# Patient Record
Sex: Male | Born: 1945 | Race: White | Hispanic: No | Marital: Married | Smoking: Never smoker
Health system: Southern US, Community
[De-identification: ages and names within clinical notes are randomized; demographics above are authoritative.]

## PROBLEM LIST (undated history)

## (undated) DIAGNOSIS — M179 Osteoarthritis of knee, unspecified: Secondary | ICD-10-CM

## (undated) DIAGNOSIS — K5790 Diverticulosis of intestine, part unspecified, without perforation or abscess without bleeding: Secondary | ICD-10-CM

## (undated) DIAGNOSIS — T18128A Food in esophagus causing other injury, initial encounter: Secondary | ICD-10-CM

## (undated) DIAGNOSIS — E039 Hypothyroidism, unspecified: Secondary | ICD-10-CM

## (undated) DIAGNOSIS — W44F3XA Food entering into or through a natural orifice, initial encounter: Secondary | ICD-10-CM

## (undated) DIAGNOSIS — R001 Bradycardia, unspecified: Secondary | ICD-10-CM

## (undated) DIAGNOSIS — M1710 Unilateral primary osteoarthritis, unspecified knee: Secondary | ICD-10-CM

## (undated) DIAGNOSIS — N401 Enlarged prostate with lower urinary tract symptoms: Secondary | ICD-10-CM

## (undated) HISTORY — PX: TOTAL KNEE ARTHROPLASTY: SHX125

## (undated) HISTORY — PX: EP IMPLANTABLE DEVICE: SHX172B

---

## 2021-04-18 ENCOUNTER — Other Ambulatory Visit (HOSPITAL_COMMUNITY): Payer: Self-pay

## 2021-04-18 ENCOUNTER — Inpatient Hospital Stay
Admission: RE | Admit: 2021-04-18 | Discharge: 2021-04-29 | Disposition: A | Discharge status: Rehabilitation Facility | Payer: Medicare Other | Source: Other Acute Inpatient Hospital | Attending: Internal Medicine | Admitting: Internal Medicine

## 2021-04-18 DIAGNOSIS — E875 Hyperkalemia: Secondary | ICD-10-CM

## 2021-04-18 DIAGNOSIS — R059 Cough, unspecified: Secondary | ICD-10-CM

## 2021-04-18 DIAGNOSIS — Z934 Other artificial openings of gastrointestinal tract status: Secondary | ICD-10-CM

## 2021-04-18 DIAGNOSIS — R5381 Other malaise: Secondary | ICD-10-CM

## 2021-04-18 DIAGNOSIS — G8918 Other acute postprocedural pain: Secondary | ICD-10-CM

## 2021-04-18 DIAGNOSIS — C9110 Chronic lymphocytic leukemia of B-cell type not having achieved remission: Secondary | ICD-10-CM

## 2021-04-18 DIAGNOSIS — D62 Acute posthemorrhagic anemia: Secondary | ICD-10-CM

## 2021-04-18 HISTORY — DX: Unilateral primary osteoarthritis, unspecified knee: M17.10

## 2021-04-18 HISTORY — DX: Osteoarthritis of knee, unspecified: M17.9

## 2021-04-18 HISTORY — DX: Food entering into or through a natural orifice, initial encounter: W44.F3XA

## 2021-04-18 HISTORY — DX: Diverticulosis of intestine, part unspecified, without perforation or abscess without bleeding: K57.90

## 2021-04-18 HISTORY — DX: Bradycardia, unspecified: R00.1

## 2021-04-18 HISTORY — DX: Hypothyroidism, unspecified: E03.9

## 2021-04-18 HISTORY — DX: Benign prostatic hyperplasia with lower urinary tract symptoms: N40.1

## 2021-04-18 HISTORY — DX: Food in esophagus causing other injury, initial encounter: T18.128A

## 2021-04-18 LAB — COMPREHENSIVE METABOLIC PANEL
ALT: 17 U/L (ref 0–44)
AST: 33 U/L (ref 15–41)
Albumin: 2.4 g/dL — ABNORMAL LOW (ref 3.5–5.0)
Alkaline Phosphatase: 106 U/L (ref 38–126)
Anion gap: 4 — ABNORMAL LOW (ref 5–15)
BUN: 23 mg/dL (ref 8–23)
CO2: 24 mmol/L (ref 22–32)
Calcium: 8.2 mg/dL — ABNORMAL LOW (ref 8.9–10.3)
Chloride: 106 mmol/L (ref 98–111)
Creatinine, Ser: 2.01 mg/dL — ABNORMAL HIGH (ref 0.61–1.24)
GFR, Estimated: 34 mL/min — ABNORMAL LOW (ref >60)
Glucose, Bld: 146 mg/dL — ABNORMAL HIGH (ref 70–99)
Potassium: 5 mmol/L (ref 3.5–5.1)
Sodium: 134 mmol/L — ABNORMAL LOW (ref 135–145)
Total Bilirubin: 0.3 mg/dL (ref 0.3–1.2)
Total Protein: 5.8 g/dL — ABNORMAL LOW (ref 6.5–8.1)

## 2021-04-18 LAB — CBC
HCT: 32.2 % — ABNORMAL LOW (ref 39.0–52.0)
Hemoglobin: 10 g/dL — ABNORMAL LOW (ref 13.0–17.0)
MCH: 29.8 pg (ref 26.0–34.0)
MCHC: 31.1 g/dL (ref 30.0–36.0)
MCV: 95.8 fL (ref 80.0–100.0)
Platelets: 377 10*3/uL (ref 150–400)
RBC: 3.36 MIL/uL — ABNORMAL LOW (ref 4.22–5.81)
RDW: 14.3 % (ref 11.5–15.5)
WBC: 165.7 10*3/uL (ref 4.0–10.5)
nRBC: 0 % (ref 0.0–0.2)

## 2021-04-18 LAB — PROTIME-INR
INR: 1.1 (ref 0.8–1.2)
Prothrombin Time: 13.9 seconds (ref 11.4–15.2)

## 2021-04-18 MED ORDER — DIATRIZOATE MEGLUMINE & SODIUM 66-10 % PO SOLN
ORAL | Status: AC
  Filled 2021-04-18: qty 30

## 2021-04-20 LAB — PATHOLOGIST SMEAR REVIEW

## 2021-04-22 LAB — BASIC METABOLIC PANEL
Anion gap: 6 (ref 5–15)
BUN: 31 mg/dL — ABNORMAL HIGH (ref 8–23)
CO2: 24 mmol/L (ref 22–32)
Calcium: 8.4 mg/dL — ABNORMAL LOW (ref 8.9–10.3)
Chloride: 103 mmol/L (ref 98–111)
Creatinine, Ser: 1.84 mg/dL — ABNORMAL HIGH (ref 0.61–1.24)
GFR, Estimated: 38 mL/min — ABNORMAL LOW (ref >60)
Glucose, Bld: 151 mg/dL — ABNORMAL HIGH (ref 70–99)
Potassium: 5.8 mmol/L — ABNORMAL HIGH (ref 3.5–5.1)
Sodium: 133 mmol/L — ABNORMAL LOW (ref 135–145)

## 2021-04-23 LAB — POTASSIUM: Potassium: 4.8 mmol/L (ref 3.5–5.1)

## 2021-04-23 LAB — TSH: TSH: 10.206 u[IU]/mL — ABNORMAL HIGH (ref 0.350–4.500)

## 2021-04-26 NOTE — PMR Pre-admission (Signed)
PMR Admission Coordinator Pre-Admission Assessment  Patient: Clem Wisenbaker is an 75 y.o., male MRN: [id number] DOB: 24-Oct-1946 Height: '5\' 7"'  (1.702 m) Weight: 97.1 kg  Insurance Information HMO:     PPO:      PCP:      IPA:      80/20: yes     OTHER:  PRIMARY: Medicare part A and B      Policy#: [id number]    Subscriber: Pt CM Name:      Phone#: Verified online 5/29     Fax#:  Pre-Cert#:       Employer:  Benefits:  Phone #:      Name:  Eff. Date: Part A effective 10/30/11, Part B effective 11/30/11   Deduct: $1556      Out of Pocket Max:  None      Life Max: N/A  CIR: 100%      SNF: 100 days Outpatient: 80%     Co-Pay: 20% Home Health: 100%      Co-Pay: none DME: 80%     Co-Pay: 20% Providers: patient's choice SECONDARY: [employer]      Policy#: [id number]     Phone#:   Financial Counselor:      Phone#:   The "Data Collection Information Summary" for patients in Inpatient Rehabilitation Facilities with attached "Privacy Act [place] Records" was provided and verbally reviewed with: Patient  Emergency Contact Information Contact Information    Name Relation Home Work Mobile   Averey, Trompeter Spouse   [phone]   Kaycee, Mcgaugh   [phone]      Current Medical History  Patient Admitting Diagnosis: Debility s/p esophageal rupture History of Present Illness: Orvin Netter Lightis a 75 y.o.malewith PMHx of bradycardia s/p PPM and hypothyroidism who presented to [facility]  due to concern for esophageal perforation. He was eating steak and a piece got stuck in his esophagus. He began to cough and then began to experience intense abdominal pain in epigastric area and radiating down his whole left flank. He was not been able to tolerate any PO solids or liquids since that time. He has a history of food impaction and required removal of a piece of chicken with endoscopy in the past. 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 This is a new diagnosis. CT abdomen was also obtained and revealed suspected esophageal rupture with extensive pneumomediastinum as well as extensive adenopathy consistent with CLL.GI consulted for consideration of esophageal stent placement. Pt. Underwent Emergent EGD during laparotomy,G-tube placement, and thoracotomy for repair of esophageal performation with intercostal muscle pedal flap coverage and washout and drain. Pt.'s stay was complicated by ileus and sepsis requiring pressors. Pt. Was initially NPO, but transitioned to puree diet (not to advance) with nocturnal TFs Pt. Was discharged to [facility] on 04/18/21 for wound/drain care. Pt. Is receiving Alloprinol for CLL, with recommendation for outpatient follow up with hematology/oncology following discharge. CIR was consulted to assist in return to PLOF     Patient's medical record from [facility] has been reviewed by the rehabilitation admission coordinator and physician.  Past Medical History  No past medical history on file.  Family History   family history is not on file.  Prior Rehab/Hospitalizations Has the patient had prior rehab or hospitalizations prior to admission? Yes  Has the patient had major surgery during 100 days prior to admission? Yes   Current  Medications No current outpatient medications on file.  Patients Current Diet: Diet puree  Precautions / Restrictions Precautions: Fall Weight Bearing Restrictions: No    Has the patient had 2 or more falls or a fall with injury in the past year? No  Prior Activity Level Household: Pt Limited Community (1-2x/wk): Pt went out 1-2x a week    Prior Functional Level Self Care: Did the patient need help bathing, dressing, using the toilet or eating? Independent  Indoor Mobility: Did the patient need assistance with walking from room to room (with or without device)? Independent  Stairs: Did the patient need  assistance with internal or external stairs (with or without device)? Independent  Functional Cognition: Did the patient need help planning regular tasks such as shopping or remembering to take medications? Independent  Home Assistive Devices / Equipment None   Prior Device Use: Indicate devices/aids used by the patient prior to current illness, exacerbation or injury? None of the above   Prior Functional Level Current Functional Level  Bed Mobility   Independent   Supervision  Transfers    Independent   Min A   Mobility - Walk/Wheelchair    Independent  20 ft x 2 Min-Mod A without AD; 50 ft min guard with RW  Upper Body Dressing  Independent  Min A   Lower Body Dressing    Independent  Min guard   Grooming    Independent  Supervision   Eating/Drinking    Independent  Not documented   Toilet Transfer    Independent  min A    Bladder Continence     Independent  Continent of bladder   Bowel Management    Independent  Continent of bowel   Stair Climbing    Independent    Not attempted  Communication    Independent   Independent   Memory    Independent   Independent    Special Needs/ Care Considerations Skin: Drain site (removed) on neck, surgical wound to abdomen, and full thickness nasal wound from NGT  Previous Home Environment (from acute therapy documentation) Living Arrangements: Spouse/significant other  Lives With: Spouse Available Help at Discharge: Family; Available 24 hours/day Type of Home: House Home Layout: One level Home Access: Stairs to enter Entrance Stairs-Rails: None Entrance Stairs-Number of Steps: 1 Bathroom Shower/Tub: Chiropodist: Standard Bathroom Accessibility: Yes How Accessible: Accessible via walker Home Care Services: Yes   Discharge Living Setting Plans for Discharge Living Setting: Patient's home; House Type of Home at Discharge: House Discharge Home Layout: One level Discharge Home  Access: Stairs to enter Entrance Stairs-Rails: None Entrance Stairs-Number of Steps: 1 Discharge Bathroom Shower/Tub: Tub/shower unit Discharge Bathroom Toilet: Standard Discharge Bathroom Accessibility: Yes How Accessible: Accessible via walker Does the patient have any problems obtaining your medications?: No   Social/Family/Support Systems Patient Roles: Spouse Contact Information: [phone] Anticipated Caregiver: Tayvian Holycross Anticipated Caregiver's Contact Information: [phone] Ability/Limitations of Caregiver: Can provide Min A Caregiver Availability: 24/7 Discharge Plan Discussed with Primary Caregiver: Yes Is Caregiver In Agreement with Plan?: Yes Does Caregiver/Family have Issues with Lodging/Transportation while Pt is in Rehab?: No   Goals Patient/Family Goal for Rehab: PT?OT/SLP Mod I Expected length of stay: 5-7 days Pt/Family Agrees to Admission and willing to participate: Yes Program Orientation Provided & Reviewed with Pt/Caregiver Including Roles  & Responsibilities: Yes   Decrease burden of Care through IP rehab admission: Specialzed equipment needs, Diet advancement (if appropriate), Decrease number of caregivers, Bowel and bladder program and  Patient/family education  Possible need for SNF placement upon discharge: Not anticipated  Patient Condition: I have reviewed medical records from [facility] [place] [facility], spoken with CM, and patient and spouse. I met with patient at the bedside and discussed via phone for inpatient rehabilitation assessment.  Patient will benefit from ongoing PT, OT and SLP, can actively participate in 3 hours of therapy a day 5 days of the week, and can make measurable gains during the admission.  Patient will also benefit from the coordinated team approach during an Inpatient Acute Rehabilitation admission.  The patient will receive intensive therapy as well as Rehabilitation physician, nursing, social worker, and  care management interventions.  Due to safety, skin/wound care, disease management, medication administration, pain management and patient education the patient requires 24 hour a day rehabilitation nursing.  The patient is currently min A-supervision with mobility and basic ADLs.  Discharge setting and therapy post discharge at home with home health is anticipated.  Patient has agreed to participate in the Acute Inpatient Rehabilitation Program and will admit today.  Preadmission Screen Completed By:  Genella Mech, 04/26/2021 10:06 AM ______________________________________________________________________   Discussed status with Dr. Posey Pronto on 04/29/2021  at 35 and received approval for admission today.  Admission Coordinator:  Genella Mech, CCC-SLP, time 10:10 on 04/29/2021   Assessment/Plan: Diagnosis: Debility   1. Does the need for close, 24 hr/day Medical supervision in concert with the patient's rehab needs make it unreasonable for this patient to be served in a less intensive setting? Yes  2. Co-Morbidities requiring supervision/potential complications: bradycardia s/p PPM, hypothyroidism (cont meds, ensure appropriate mood and energy level for therapies), see HPI 3. Due to bowel management, safety, skin/wound care, disease management, pain management and patient education, does the patient require 24 hr/day rehab nursing? Yes 4. Does the patient require coordinated care of a physician, rehab nurse, PT, OT, SLP to address physical and functional deficits in the context of the above medical diagnosis(es)? Yes Addressing deficits in the following areas: balance, endurance, locomotion, strength, transferring, bathing, dressing, swallowing and psychosocial support 5. Can the patient actively participate in an intensive therapy program of at least 3 hrs of therapy 5 days a week? Yes 6. The potential for patient to make measurable gains while on inpatient rehab is excellent and good 7. Anticipated  functional outcomes upon discharge from inpatient rehab: modified independent PT, modified independent OT, modified independent SLP 8. Estimated rehab length of stay to reach the above functional goals is: 5 to 7 days. 9. Anticipated discharge destination: Home 10. Overall Rehab/Functional Prognosis: excellent and good  MD Signature Delice Lesch, MD, ABPMR

## 2021-04-27 LAB — CBC
HCT: 31.5 % — ABNORMAL LOW (ref 39.0–52.0)
Hemoglobin: 9.9 g/dL — ABNORMAL LOW (ref 13.0–17.0)
MCH: 30.7 pg (ref 26.0–34.0)
MCHC: 31.4 g/dL (ref 30.0–36.0)
MCV: 97.8 fL (ref 80.0–100.0)
Platelets: 279 10*3/uL (ref 150–400)
RBC: 3.22 MIL/uL — ABNORMAL LOW (ref 4.22–5.81)
RDW: 14.3 % (ref 11.5–15.5)
WBC: 187.8 10*3/uL (ref 4.0–10.5)
nRBC: 0 % (ref 0.0–0.2)

## 2021-04-27 LAB — BASIC METABOLIC PANEL
Anion gap: 6 (ref 5–15)
BUN: 38 mg/dL — ABNORMAL HIGH (ref 8–23)
CO2: 27 mmol/L (ref 22–32)
Calcium: 8.9 mg/dL (ref 8.9–10.3)
Chloride: 102 mmol/L (ref 98–111)
Creatinine, Ser: 1.83 mg/dL — ABNORMAL HIGH (ref 0.61–1.24)
GFR, Estimated: 38 mL/min — ABNORMAL LOW (ref >60)
Glucose, Bld: 170 mg/dL — ABNORMAL HIGH (ref 70–99)
Potassium: 5.2 mmol/L — ABNORMAL HIGH (ref 3.5–5.1)
Sodium: 135 mmol/L (ref 135–145)

## 2021-04-27 LAB — POTASSIUM: Potassium: 5.3 mmol/L — ABNORMAL HIGH (ref 3.5–5.1)

## 2021-04-28 LAB — POTASSIUM: Potassium: 5.7 mmol/L — ABNORMAL HIGH (ref 3.5–5.1)

## 2021-04-29 ENCOUNTER — Inpatient Hospital Stay (HOSPITAL_COMMUNITY)
Admission: RE | Admit: 2021-04-29 | Discharge: 2021-05-06 | DRG: 945 | Disposition: A | Discharge status: Home Health | Payer: Medicare Other | Source: Other Acute Inpatient Hospital | Attending: Physical Medicine and Rehabilitation | Admitting: Physical Medicine and Rehabilitation

## 2021-04-29 ENCOUNTER — Encounter (HOSPITAL_COMMUNITY): Payer: Self-pay | Admitting: Physical Medicine and Rehabilitation

## 2021-04-29 ENCOUNTER — Other Ambulatory Visit: Payer: Self-pay

## 2021-04-29 ENCOUNTER — Encounter: Payer: Self-pay | Admitting: Internal Medicine

## 2021-04-29 DIAGNOSIS — E039 Hypothyroidism, unspecified: Secondary | ICD-10-CM | POA: Diagnosis present

## 2021-04-29 DIAGNOSIS — D62 Acute posthemorrhagic anemia: Secondary | ICD-10-CM

## 2021-04-29 DIAGNOSIS — M109 Gout, unspecified: Secondary | ICD-10-CM | POA: Diagnosis present

## 2021-04-29 DIAGNOSIS — I10 Essential (primary) hypertension: Secondary | ICD-10-CM | POA: Diagnosis present

## 2021-04-29 DIAGNOSIS — K222 Esophageal obstruction: Secondary | ICD-10-CM | POA: Diagnosis present

## 2021-04-29 DIAGNOSIS — Z6830 Body mass index (BMI) 30.0-30.9, adult: Secondary | ICD-10-CM | POA: Diagnosis not present

## 2021-04-29 DIAGNOSIS — Z96651 Presence of right artificial knee joint: Secondary | ICD-10-CM | POA: Diagnosis present

## 2021-04-29 DIAGNOSIS — E875 Hyperkalemia: Secondary | ICD-10-CM

## 2021-04-29 DIAGNOSIS — Z950 Presence of cardiac pacemaker: Secondary | ICD-10-CM | POA: Diagnosis not present

## 2021-04-29 DIAGNOSIS — C9110 Chronic lymphocytic leukemia of B-cell type not having achieved remission: Secondary | ICD-10-CM

## 2021-04-29 DIAGNOSIS — E119 Type 2 diabetes mellitus without complications: Secondary | ICD-10-CM | POA: Diagnosis present

## 2021-04-29 DIAGNOSIS — R5381 Other malaise: Secondary | ICD-10-CM | POA: Diagnosis present

## 2021-04-29 DIAGNOSIS — G8918 Other acute postprocedural pain: Secondary | ICD-10-CM

## 2021-04-29 DIAGNOSIS — R3915 Urgency of urination: Secondary | ICD-10-CM | POA: Diagnosis present

## 2021-04-29 DIAGNOSIS — H9190 Unspecified hearing loss, unspecified ear: Secondary | ICD-10-CM | POA: Diagnosis present

## 2021-04-29 DIAGNOSIS — E669 Obesity, unspecified: Secondary | ICD-10-CM | POA: Diagnosis present

## 2021-04-29 DIAGNOSIS — Z931 Gastrostomy status: Secondary | ICD-10-CM | POA: Diagnosis not present

## 2021-04-29 DIAGNOSIS — N401 Enlarged prostate with lower urinary tract symptoms: Secondary | ICD-10-CM | POA: Diagnosis present

## 2021-04-29 DIAGNOSIS — R1319 Other dysphagia: Secondary | ICD-10-CM | POA: Diagnosis not present

## 2021-04-29 LAB — GLUCOSE, CAPILLARY
Glucose-Capillary: 134 mg/dL — ABNORMAL HIGH (ref 70–99)
Glucose-Capillary: 140 mg/dL — ABNORMAL HIGH (ref 70–99)

## 2021-04-29 LAB — POTASSIUM: Potassium: 4.3 mmol/L (ref 3.5–5.1)

## 2021-04-29 MED ORDER — ALUM & MAG HYDROXIDE-SIMETH 200-200-20 MG/5ML PO SUSP: 30.0000 mL | ORAL | Status: DC | PRN

## 2021-04-29 MED ORDER — FLEET ENEMA 7-19 GM/118ML RE ENEM: 1.0000 | ENEMA | Freq: Once | RECTAL | Status: DC | PRN

## 2021-04-29 MED ORDER — LEVOTHYROXINE SODIUM 88 MCG PO TABS
88.0000 ug | ORAL_TABLET | Freq: Every day | ORAL | Status: DC
  Administered 2021-04-30 – 2021-05-06 (×7): 88 ug via ORAL
  Filled 2021-04-29 (×7): qty 1

## 2021-04-29 MED ORDER — PROCHLORPERAZINE MALEATE 5 MG PO TABS: 5.0000 mg | ORAL_TABLET | Freq: Four times a day (QID) | ORAL | Status: DC | PRN

## 2021-04-29 MED ORDER — ACETAMINOPHEN 325 MG PO TABS
325.0000 mg | ORAL_TABLET | ORAL | Status: DC | PRN
  Administered 2021-05-02: 500 mg via ORAL
  Filled 2021-04-29: qty 1

## 2021-04-29 MED ORDER — PROSOURCE PLUS PO LIQD
30.0000 mL | Freq: Two times a day (BID) | ORAL | Status: DC
  Administered 2021-04-29 – 2021-05-05 (×13): 30 mL via ORAL
  Filled 2021-04-29 (×7): qty 30

## 2021-04-29 MED ORDER — INSULIN ASPART 100 UNIT/ML IJ SOLN: 0.0000 [IU] | Freq: Every day | INTRAMUSCULAR | Status: DC

## 2021-04-29 MED ORDER — PROCHLORPERAZINE EDISYLATE 10 MG/2ML IJ SOLN
5.0000 mg | Freq: Four times a day (QID) | INTRAMUSCULAR | Status: DC | PRN
Start: 2021-04-29 — End: 2021-05-06

## 2021-04-29 MED ORDER — TRAZODONE HCL 50 MG PO TABS
25.0000 mg | ORAL_TABLET | Freq: Every evening | ORAL | Status: DC | PRN
  Administered 2021-05-01: 50 mg via ORAL
  Filled 2021-04-29: qty 1

## 2021-04-29 MED ORDER — ENOXAPARIN SODIUM 40 MG/0.4ML IJ SOSY
40.0000 mg | PREFILLED_SYRINGE | INTRAMUSCULAR | Status: DC
  Administered 2021-04-30 – 2021-05-05 (×6): 40 mg via SUBCUTANEOUS
  Filled 2021-04-29 (×6): qty 0.4

## 2021-04-29 MED ORDER — GUAIFENESIN-DM 100-10 MG/5ML PO SYRP: 5.0000 mL | ORAL_SOLUTION | Freq: Four times a day (QID) | ORAL | Status: DC | PRN

## 2021-04-29 MED ORDER — INSULIN ASPART 100 UNIT/ML IJ SOLN
0.0000 [IU] | Freq: Three times a day (TID) | INTRAMUSCULAR | Status: DC
  Administered 2021-04-29 – 2021-04-30 (×2): 1 [IU] via SUBCUTANEOUS
  Administered 2021-04-30: 2 [IU] via SUBCUTANEOUS
  Administered 2021-04-30 – 2021-05-03 (×8): 1 [IU] via SUBCUTANEOUS
  Administered 2021-05-03: 2 [IU] via SUBCUTANEOUS
  Administered 2021-05-03 – 2021-05-06 (×8): 1 [IU] via SUBCUTANEOUS

## 2021-04-29 MED ORDER — ALLOPURINOL 100 MG PO TABS
100.0000 mg | ORAL_TABLET | Freq: Every day | ORAL | Status: DC
  Administered 2021-04-30 – 2021-05-06 (×7): 100 mg via ORAL
  Filled 2021-04-29 (×7): qty 1

## 2021-04-29 MED ORDER — OXYCODONE HCL 5 MG PO TABS: 5.0000 mg | ORAL_TABLET | Freq: Four times a day (QID) | ORAL | Status: DC | PRN

## 2021-04-29 MED ORDER — SACCHAROMYCES BOULARDII 250 MG PO CAPS
250.0000 mg | ORAL_CAPSULE | Freq: Two times a day (BID) | ORAL | Status: DC
  Administered 2021-04-29 – 2021-05-06 (×14): 250 mg via ORAL
  Filled 2021-04-29 (×14): qty 1

## 2021-04-29 MED ORDER — DIPHENHYDRAMINE HCL 12.5 MG/5ML PO ELIX: 12.5000 mg | ORAL_SOLUTION | Freq: Four times a day (QID) | ORAL | Status: DC | PRN

## 2021-04-29 MED ORDER — PROCHLORPERAZINE 25 MG RE SUPP: 12.5000 mg | Freq: Four times a day (QID) | RECTAL | Status: DC | PRN

## 2021-04-29 MED ORDER — MELATONIN 3 MG PO TABS
3.0000 mg | ORAL_TABLET | Freq: Every day | ORAL | Status: DC
  Administered 2021-04-29 – 2021-05-05 (×6): 3 mg via ORAL
  Filled 2021-04-29 (×6): qty 1

## 2021-04-29 MED ORDER — BISACODYL 10 MG RE SUPP: 10.0000 mg | Freq: Every day | RECTAL | Status: DC | PRN

## 2021-04-29 MED ORDER — POLYETHYLENE GLYCOL 3350 17 G PO PACK
17.0000 g | PACK | Freq: Every day | ORAL | Status: DC | PRN
  Filled 2021-04-29: qty 1

## 2021-04-29 MED ORDER — PANTOPRAZOLE SODIUM 40 MG PO TBEC
40.0000 mg | DELAYED_RELEASE_TABLET | Freq: Every day | ORAL | Status: DC
  Administered 2021-04-30 – 2021-05-06 (×7): 40 mg via ORAL
  Filled 2021-04-29 (×7): qty 1

## 2021-04-29 NOTE — H&P (Addendum)
Physical Medicine and Rehabilitation Admission H&P    CC: Debility   HPI: Cesar Ayers is a 75 year old male with history of bradycardia s/p PPM, diverticulosis, BPH w/ hesitancy, hypothyroidism, history of food impaction in the past;  who was admitted on [facility] from OSH on 03/29/2021 with esophageal rupture with extensive pneumomediastinum due to impacted piece of meat, extensive mediastinal/retroperitoneal/inguinal adenopathy and significant leukocytosis -179 with numerous blasts . He underwent repair of esophageal perforation with intercostal muscle flap, wash out and drainage by Dr. Lorrin Jackson as well as laparotomy with emergent EGD for FB removal as well as reduction of incarceration of incisional hernia and gastrostomy tube placement on the same day.  Hospital course complicated by septic shock, delirium, ileus, persistent diarrhea with abdominal distension, acute on chronic renal failure, fluid overload, full thickness coumellar wound from NGT treated with local measures per plastics as well as pain. He was started on dysphagia 1 diet ( not advanced due to esophageal stricture) as well as nocturnal tube feeds.   Hematology/oncology consulted and he was started on allopurinol for TLS prophylaxis with plans to follow up on outpatient basis-->wife to schedule.   He was discharged to [facility] on 04/18/2021 for wound drainage/care and debility. Wound have healed well and diet advanced to D3 on 04/28/21. He has had issues with hyperglycemia, intermittent hyperkalemia as well as persistent leucocytosis. TSH elevated at 10.26 and synthroid increased to 88 mcg on 05/26.   Patient with resulting functional deficits with endurance and mobility.  Please see preadmission assessment earlier today as well.   Review of Systems  Constitutional: Negative for chills and fever.  HENT: Positive for hearing loss. Negative for tinnitus.   Eyes: Negative for blurred vision and double vision.  Respiratory: Negative for  cough, shortness of breath and stridor.   Cardiovascular: Positive for chest pain (soreness).  Gastrointestinal: Negative for constipation, heartburn and nausea.  Genitourinary: Negative for dysuria and urgency.  Musculoskeletal: Negative for back pain, joint pain and myalgias.  Skin: Negative for rash.  Neurological: Positive for weakness. Negative for dizziness, focal weakness and headaches.  Psychiatric/Behavioral: The patient is not nervous/anxious and does not have insomnia.   All other systems reviewed and are negative.    Past Medical History:  Diagnosis Date  . Benign prostatic hyperplasia (BPH) with urinary urgency   . Bradycardia   . Diverticulosis   . Food impaction of esophagus   . Hypothyroid   . OA (osteoarthritis) of knee      Past Surgical History:  Procedure Laterality Date  . EP IMPLANTABLE DEVICE    . TOTAL KNEE ARTHROPLASTY Right      Family History  Problem Relation Age of Onset  . Stroke Mother   . Cancer Father   . Cancer Brother        in stomach/advaned on diagnosis     Social History: Married. 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 He smoke cigarrettes briefly. Has not used any alcohol since the 70's. He does not use illicit drugs.     Allergies: No Known Allergies    No medications prior to admission.    Drug Regimen Review  Drug regimen was reviewed and remains appropriate with no significant issues identified  Home: One level home with multiple stairs at entry.    Functional History: Independent without AD  Functional Status:  Mobility: MIn assist for transfers  Min/guard assist to ambulate 68' with RW Min to mod assist for ambulating 20' without  AD.    ADL: Min to min guard assist for ADL tasks.   Cognition:      Physical Exam: There were no vitals taken for this visit. Physical Exam Vitals reviewed.  Constitutional:      Appearance: He is obese.  HENT:     Head: Normocephalic and atraumatic.     Right  Ear: External ear normal.     Left Ear: External ear normal.     Nose: Nose normal.  Eyes:     General:        Right eye: No discharge.        Left eye: No discharge.     Extraocular Movements: Extraocular movements intact.  Cardiovascular:     Rate and Rhythm: Normal rate and regular rhythm.  Pulmonary:     Effort: Pulmonary effort is normal. No respiratory distress.     Breath sounds: No stridor.  Abdominal:     General: Abdomen is flat. Bowel sounds are normal.     Comments: + Feeding tube  Musculoskeletal:     Cervical back: Normal range of motion and neck supple.     Comments: No edema or tenderness in extremities  Skin:    General: Skin is warm and dry.     Comments: Left forehead with nickel size ulcerated lesion. Large dry scab nasal septum.   Neurological:     Mental Status: He is alert.     Comments: Alert HOH Motor: 4+/5 throughout  Psychiatric:        Mood and Affect: Mood normal.        Behavior: Behavior normal.     Results for orders placed or performed during the hospital encounter of 04/18/21 (from the past 48 hour(s))  Potassium     Status: Abnormal   Collection Time: 04/28/21  3:23 AM  Result Value Ref Range   Potassium 5.7 (H) 3.5 - 5.1 mmol/L    Comment: Performed at [facility], [address], [place], [place] [address]  Potassium     Status: None   Collection Time: 04/29/21  4:53 AM  Result Value Ref Range   Potassium 4.3 3.5 - 5.1 mmol/L    Comment: Performed at [facility] [address] [place], [place] [address]   No results found.     Medical Problem List and Plan: 1.  Deficits with mobility, endurance secondary to debility  -patient may not shower  -ELOS/Goals: 5-7 days/mod I  Admit to CIR 2.  Antithrombotics: -DVT/anticoagulation:  Pharmaceutical: Lovenox  -antiplatelet therapy: N/a 3. Pain Management:   Oxycodone as needed 4. Mood: LCSW to follow for evaluation and support.   -antipsychotic agents: N/a 5.  Neuropsych: This patient is capable of making decisions on his own behalf. 6. Skin/Wound Care:   Routine Feeding tube care.   Continue water flushes per tube.  7. Fluids/Electrolytes/Nutrition: Monitor I/O. Check lytes in am.  8.  Hyperkalemia: Question due to tube feeds/CKD.   CMP ordered for tomorrow 9.  CLL:  WBCs 187.8 on 5/30  Follow-up as outpatient [facility] attempting to set F/u)  Continue to monitor 10.  Acute blood loss anemia  Hemoglobin 9.9 on 5/30  CBC ordered for tomorrow 11. H/o esophageal stricture:  Tolerating Dysphagia 3, thins with good intake.   Add intermittent supervision at meals 12. T2DM- diet controlled.  HbA1c ordered for tomorrow AM  Continue to monitor BS ac/hs 12. CKD: Baseline SCr 1.3 per records review  SCr improving from  2.0-->1.83.   CMP ordered for tomorrow 13. Hypothyroid: Was on 75 mcg/day PTA.   -- Synthroid increased to 88 mcg on 05/26.    Bary Leriche, PA-C 04/29/2021   I have personally performed a face to face diagnostic evaluation, including, but not limited to relevant history and physical exam findings, of this patient and developed relevant assessment and plan.  Additionally, I have reviewed and concur with the physician assistant's documentation above.  Delice Lesch, MD, ABPMR  The patient's status has not changed. Any changes from the pre-admission screening or documentation from the acute chart are noted above.   Delice Lesch, MD, ABPMR

## 2021-04-29 NOTE — H&P (Signed)
Physical Medicine and Rehabilitation Admission H&P    CC: Debility   HPI: Cesar Ayers is a 75 year old male with history of bradycardia s/p PPM, diverticulosis, BPH w/ hesitancy, hypothyroidism, history of food impaction in the past;  who [facility] with esophageal rupture with extensive pneumomediastinum due to impacted piece of meat, extensive mediastinal/retroperitoneal/inguinal adenopathy and significant leukocytosis -179 with numerous blasts . He underwent repair of esophageal perforation with intercostal muscle flap, wash out and drainage by Dr. Lorrin Jackson as well as laparotomy with emergent EGD for FB removal as well as reduction of incarceration of incisional hernia and gastrostomy tube placement on the same day.  Hospital course complicated by septic shock, delirium, ileus, persistent diarrhea with abdominal distension, acute on chronic renal failure, fluid overload, full thickness coumellar wound from NGT treated with local measures per plastics as well as pain. He was started on dysphagia 1 diet ( not advanced due to esophageal stricture) as well as nocturnal tube feeds.   Hematology/oncology consulted and he was started on allopurinol for TLS prophylaxis with plans to follow up on outpatient basis-->wife to schedule.   He was discharged to [facility] on 04/18/2021 for wound drainage/care and debility. Wound have healed well and diet advanced to D3 on 04/28/21. He has had issues with hyperglycemia, intermittent hyperkalemia as well as persistent leucocytosis. TSH elevated at 10.26 and synthroid increased to 88 mcg on 05/26.   Patient with resulting functional deficits with endurance and mobility.  Please see preadmission assessment earlier today as well.   Review of Systems  Constitutional: Negative for chills and fever.  HENT: Positive for hearing loss. Negative for tinnitus.   Eyes: Negative for blurred vision and double vision.  Respiratory: Negative for  cough, shortness of breath and stridor.   Cardiovascular: Positive for chest pain (soreness).  Gastrointestinal: Negative for constipation, heartburn and nausea.  Genitourinary: Negative for dysuria and urgency.  Musculoskeletal: Negative for back pain, joint pain and myalgias.  Skin: Negative for rash.  Neurological: Positive for weakness. Negative for dizziness, focal weakness and headaches.  Psychiatric/Behavioral: The patient is not nervous/anxious and does not have insomnia.   All other systems reviewed and are negative.    Past Medical History:  Diagnosis Date  . Benign prostatic hyperplasia (BPH) with urinary urgency   . Bradycardia   . Diverticulosis   . Food impaction of esophagus   . Hypothyroid   . OA (osteoarthritis) of knee      Past Surgical History:  Procedure Laterality Date  . EP IMPLANTABLE DEVICE    . TOTAL KNEE ARTHROPLASTY Right      Family History  Problem Relation Age of Onset  . Stroke Mother   . Cancer Father   . Cancer Brother        in stomach/advaned on diagnosis     Social History: Married. 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 He smoke cigarrettes briefly. Has not used any alcohol since the 70's. He does not use illicit drugs.     Allergies: No Known Allergies    No medications prior to admission.    Drug Regimen Review  Drug regimen was reviewed and remains appropriate with no significant issues identified  Home: One level home with multiple stairs at entry.    Functional History: Independent without AD  Functional Status:  Mobility: MIn assist for transfers  Min/guard assist to ambulate 45' with RW Min to mod assist for ambulating 20' without  AD.    ADL: Min to min guard assist for ADL tasks.   Cognition:      Physical Exam: There were no vitals taken for this visit. Physical Exam Vitals reviewed.  Constitutional:      Appearance: He is obese.  HENT:     Head: Normocephalic and atraumatic.     Right  Ear: External ear normal.     Left Ear: External ear normal.     Nose: Nose normal.  Eyes:     General:        Right eye: No discharge.        Left eye: No discharge.     Extraocular Movements: Extraocular movements intact.  Cardiovascular:     Rate and Rhythm: Normal rate and regular rhythm.  Pulmonary:     Effort: Pulmonary effort is normal. No respiratory distress.     Breath sounds: No stridor.  Abdominal:     General: Abdomen is flat. Bowel sounds are normal.     Comments: + Feeding tube  Musculoskeletal:     Cervical back: Normal range of motion and neck supple.     Comments: No edema or tenderness in extremities  Skin:    General: Skin is warm and dry.     Comments: Left forehead with nickel size ulcerated lesion. Large dry scab nasal septum.   Neurological:     Mental Status: He is alert.     Comments: Alert HOH Motor: 4+/5 throughout  Psychiatric:        Mood and Affect: Mood normal.        Behavior: Behavior normal.     Results for orders placed or performed during the hospital encounter of 04/18/21 (from the past 48 hour(s))  Potassium     Status: Abnormal   Collection Time: 04/28/21  3:23 AM  Result Value Ref Range   Potassium 5.7 (H) 3.5 - 5.1 mmol/L    Comment: Performed at [facility], [address] [place], [place] [address]  Potassium     Status: None   Collection Time: 04/29/21  4:53 AM  Result Value Ref Range   Potassium 4.3 3.5 - 5.1 mmol/L    Comment: Performed at [facility] [address], [place], [place] [address]   No results found.     Medical Problem List and Plan: 1.  Deficits with mobility, endurance secondary to debility  -patient may not shower  -ELOS/Goals: 5-7 days/mod I  Admit to CIR 2.  Antithrombotics: -DVT/anticoagulation:  Pharmaceutical: Lovenox  -antiplatelet therapy: N/a 3. Pain Management:   Oxycodone as needed 4. Mood: LCSW to follow for evaluation and support.   -antipsychotic agents: N/a 5.  Neuropsych: This patient is capable of making decisions on his own behalf. 6. Skin/Wound Care:   Routine Feeding tube care.   Continue water flushes per tube.  7. Fluids/Electrolytes/Nutrition: Monitor I/O. Check lytes in am.  8.  Hyperkalemia: Question due to tube feeds/CKD.   CMP ordered for tomorrow 9.  CLL:  WBCs 187.8 on 5/30  Follow-up as outpatient [facility] attempting to set F/u)  Continue to monitor 10.  Acute blood loss anemia  Hemoglobin 9.9 on 5/30  CBC ordered for tomorrow 11. H/o esophageal stricture:  Tolerating Dysphagia 3, thins with good intake.   Add intermittent supervision at meals 12. T2DM- diet controlled.  HbA1c ordered for tomorrow AM  Continue to monitor BS ac/hs 12. CKD: Baseline SCr 1.3 per records review  SCr improving from  2.0-->1.83.   CMP ordered for tomorrow 13. Hypothyroid: Was on 75 mcg/day PTA.   -- Synthroid increased to 88 mcg on 05/26.    Bary Leriche, PA-C 04/29/2021   I have personally performed a face to face diagnostic evaluation, including, but not limited to relevant history and physical exam findings, of this patient and developed relevant assessment and plan.  Additionally, I have reviewed and concur with the physician assistant's documentation above.  Delice Lesch, MD, ABPMR

## 2021-04-29 NOTE — Progress Notes (Signed)
PMR Admission Coordinator Pre-Admission Assessment  Patient: Cesar Ayers is an 75 y.o., male MRN: [id number] DOB: 07/31/1946 Height: '5\' 7"'  (1.702 m) Weight: 97.1 kg  Insurance Information HMO:     PPO:      PCP:      IPA:      80/20: yes     OTHER:  PRIMARY: Medicare part A and B      Policy#: [id number]    Subscriber: Pt CM Name:      Phone#: Verified online 5/29     Fax#:  Pre-Cert#:       Employer:  Benefits:  Phone #:      Name:  Eff. Date: Part A effective 10/30/11, Part B effective 11/30/11   Deduct: $6546TKP of [place] Max: NoneLife Max: N/A CIR:100%SNF: 100 days Outpatient:80%Co-Pay: 20% Home Health:100%Co-Pay: none DME:80%Co-Pay: 20% Providers:patient's choice SECONDARY: [employer]      Policy#: [id number]     Phone#:   Financial Counselor:      Phone#:   The "Data Collection Information Summary" for patients in Inpatient Rehabilitation Facilities with attached "Privacy Act [place] Records" was provided and verbally reviewed with: Patient  Emergency Contact Information         Contact Information    Name Relation Home Work Mobile   Cesar, Ayers Spouse   [phone]   Cesar, Ayers   [phone]      Current Medical History  Patient Admitting Diagnosis: Debility s/p esophageal rupture History of Present Illness: Cesar Dewan Lightis a 75 y.o.malewith PMHx of bradycardia s/p PPM and hypothyroidism who presented to [facility]  due to concern for esophageal perforation. He was eating steak and a piece got stuck in his esophagus. He began to cough and then began to experience intense abdominal pain in epigastric area and radiating down his whole left flank. He was not been able to tolerate any PO solids or liquids since that time. He has a history of food impaction and required removal of a piece of chicken with endoscopy in the past. 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 This is a new diagnosis. CT abdomen was also obtained and revealed suspected esophageal rupture with extensive pneumomediastinum as well as extensive adenopathy consistent with CLL.GI consulted for consideration of esophageal stent placement. Pt. Underwent Emergent EGD during laparotomy,G-tube placement, and thoracotomy for repair of esophageal performation with intercostal muscle pedal flap coverage and washout and drain. Pt.'s stay was complicated by ileus and sepsis requiring pressors. Pt. Was initially NPO, but transitioned to puree diet (not to advance) with nocturnal TFs Pt. Was discharged to [facility] on 04/18/21 for wound/drain care. Pt. Is receiving Alloprinol for CLL, with recommendation for outpatient follow up with hematology/oncology following discharge. CIR was consulted to assist in return to PLOF     Patient's medical record from [facility] has been reviewed by the rehabilitation admission coordinator and physician.  Past Medical History  No past medical history on file.  Family History   family history is not on file.  Prior Rehab/Hospitalizations Has the patient had prior rehab or hospitalizations prior to admission? Yes  Has the patient had major surgery during 100 days prior to admission? Yes             Current Medications No current outpatient medications on file.  Patients Current Diet: Diet puree  Precautions / Restrictions Precautions: Fall Weight Bearing Restrictions: No  Has the patient had 2 or more falls or a fall with injury in the past year? No  Prior Activity Level Household: Pt Limited Community (1-2x/wk): Pt went out 1-2x a week    Prior Functional Level Self Care: Did the patient need help bathing, dressing, using the toilet or eating? Independent  Indoor Mobility: Did the patient need assistance with walking from room to room (with or without device)?  Independent  Stairs: Did the patient need assistance with internal or external stairs (with or without device)? Independent  Functional Cognition: Did the patient need help planning regular tasks such as shopping or remembering to take medications? Independent  Home Assistive Devices / Equipment None   Prior Device Use: Indicate devices/aids used by the patient prior to current illness, exacerbation or injury? None of the above   Prior Functional Level Current Functional Level  Bed Mobility   Independent Supervision  Transfers    Independent   Min A   Mobility - Walk/Wheelchair  Independent  20 ft x 2 Min-Mod A without AD; 50 ft min guard with RW  Upper Body Dressing  Independent  Min A   Lower Body Dressing  Independent  Min guard   Grooming  Independent  Supervision   Eating/Drinking  Independent  Not documented   Toilet Transfer  Independent  min A    Bladder Continence   Independent  Continent of bladder   Bowel Management  Independent  Continent of bowel   Stair Climbing    Independent  Not attempted  Communication  Independent   Independent   Memory    Independent Independent    Special Needs/ Care Considerations Skin: Drain site (removed) on neck, surgical wound to abdomen, and full thickness nasal wound from NGT  Previous Home Environment (from acute therapy documentation) Living Arrangements: Spouse/significant other  Lives With: Spouse Available Help at Discharge: Family; Available 24 hours/day Type of Home: House Home Layout: One level Home Access: Stairs to enter Entrance Stairs-Rails: None Entrance Stairs-Number of Steps: 1 Bathroom Shower/Tub: Chiropodist: Standard Bathroom Accessibility: Yes How Accessible: Accessible via walker Home Care Services: Yes   Discharge Living Setting Plans for Discharge Living Setting: Patient's home; House Type of Home at Discharge:  House Discharge Home Layout: One level Discharge Home Access: Stairs to enter Entrance Stairs-Rails: None Entrance Stairs-Number of Steps: 1 Discharge Bathroom Shower/Tub: Tub/shower unit Discharge Bathroom Toilet: Standard Discharge Bathroom Accessibility: Yes How Accessible: Accessible via walker Does the patient have any problems obtaining your medications?: No   Social/Family/Support Systems Patient Roles: Spouse Contact Information: [phone] Anticipated Caregiver: Philbert Ocallaghan Anticipated Caregiver's Contact Information: [phone] Ability/Limitations of Caregiver: Can provide Min A Caregiver Availability: 24/7 Discharge Plan Discussed with Primary Caregiver: Yes Is Caregiver In Agreement with Plan?: Yes Does Caregiver/Family have Issues with Lodging/Transportation while Pt is in Rehab?: No   Goals Patient/Family Goal for Rehab: PT?OT/SLP Mod I Expected length of stay: 5-7 days Pt/Family Agrees to Admission and willing to participate: Yes Program Orientation Provided & Reviewed with Pt/Caregiver Including Roles  & Responsibilities: Yes   Decrease burden of Care through IP rehab admission: Specialzed equipment needs, Diet advancement (if appropriate), Decrease number of caregivers, Bowel and bladder program and Patient/family education  Possible need for SNF placement upon discharge: Not anticipated  Patient Condition: I have reviewed medical records from [facility] and [place], spoken with CM, and patient and spouse. I met with patient at the bedside and discussed via phone for inpatient  rehabilitation assessment.  Patient will benefit from ongoing PT, OT and SLP, can actively participate in 3 hours of therapy a day 5 days of the week, and can make measurable gains during the admission.  Patient will also benefit from the coordinated team approach during an Inpatient Acute Rehabilitation admission.  The patient will receive intensive therapy as  well as Rehabilitation physician, nursing, social worker, and care management interventions.  Due to safety, skin/wound care, disease management, medication administration, pain management and patient education the patient requires 24 hour a day rehabilitation nursing.  The patient is currently min A-supervision with mobility and basic ADLs.  Discharge setting and therapy post discharge at home with home health is anticipated.  Patient has agreed to participate in the Acute Inpatient Rehabilitation Program and will admit today.  Preadmission Screen Completed By:  Genella Mech, 04/26/2021 10:06 AM ______________________________________________________________________   Discussed status with Dr. Posey Pronto on 04/29/2021  at 45 and received approval for admission today.  Admission Coordinator:  Genella Mech, CCC-SLP, time 10:10 on 04/29/2021   Assessment/Plan: Diagnosis: Debility   1. Does the need for close, 24 hr/day Medical supervision in concert with the patient's rehab needs make it unreasonable for this patient to be served in a less intensive setting? Yes  2. Co-Morbidities requiring supervision/potential complications: bradycardia s/p PPM, hypothyroidism (cont meds, ensure appropriate mood and energy level for therapies), see HPI 3. Due to bowel management, safety, skin/wound care, disease management, pain management and patient education, does the patient require 24 hr/day rehab nursing? Yes 4. Does the patient require coordinated care of a physician, rehab nurse, PT, OT, SLP to address physical and functional deficits in the context of the above medical diagnosis(es)? Yes Addressing deficits in the following areas: balance, endurance, locomotion, strength, transferring, bathing, dressing, swallowing and psychosocial support 5. Can the patient actively participate in an intensive therapy program of at least 3 hrs of therapy 5 days a week? Yes 6. The potential for patient to make measurable  gains while on inpatient rehab is excellent and good 7. Anticipated functional outcomes upon discharge from inpatient rehab: modified independent PT, modified independent OT, modified independent SLP 8. Estimated rehab length of stay to reach the above functional goals is: 5 to 7 days. 9. Anticipated discharge destination: Home 10. Overall Rehab/Functional Prognosis: excellent and good  MD Signature Delice Lesch, MD, ABPMR

## 2021-04-30 DIAGNOSIS — R1319 Other dysphagia: Secondary | ICD-10-CM

## 2021-04-30 DIAGNOSIS — C9110 Chronic lymphocytic leukemia of B-cell type not having achieved remission: Secondary | ICD-10-CM

## 2021-04-30 DIAGNOSIS — E875 Hyperkalemia: Secondary | ICD-10-CM

## 2021-04-30 DIAGNOSIS — R5381 Other malaise: Principal | ICD-10-CM

## 2021-04-30 LAB — CBC WITH DIFFERENTIAL/PLATELET
Abs Immature Granulocytes: 0.54 10*3/uL — ABNORMAL HIGH (ref 0.00–0.07)
Basophils Absolute: 1.7 10*3/uL — ABNORMAL HIGH (ref 0.0–0.1)
Basophils Relative: 1 %
Eosinophils Absolute: 0.6 10*3/uL — ABNORMAL HIGH (ref 0.0–0.5)
Eosinophils Relative: 0 %
HCT: 31.7 % — ABNORMAL LOW (ref 39.0–52.0)
Hemoglobin: 9.8 g/dL — ABNORMAL LOW (ref 13.0–17.0)
Immature Granulocytes: 0 %
Lymphocytes Relative: 88 %
Lymphs Abs: 182 10*3/uL — ABNORMAL HIGH (ref 0.7–4.0)
MCH: 30.2 pg (ref 26.0–34.0)
MCHC: 30.9 g/dL (ref 30.0–36.0)
MCV: 97.8 fL (ref 80.0–100.0)
Monocytes Absolute: 15.6 10*3/uL — ABNORMAL HIGH (ref 0.1–1.0)
Monocytes Relative: 8 %
Neutro Abs: 5.7 10*3/uL (ref 1.7–7.7)
Neutrophils Relative %: 3 %
Platelets: 253 10*3/uL (ref 150–400)
RBC: 3.24 MIL/uL — ABNORMAL LOW (ref 4.22–5.81)
RDW: 14.6 % (ref 11.5–15.5)
WBC: 206.1 10*3/uL (ref 4.0–10.5)
nRBC: 0 % (ref 0.0–0.2)

## 2021-04-30 LAB — GLUCOSE, CAPILLARY
Glucose-Capillary: 158 mg/dL — ABNORMAL HIGH (ref 70–99)
Glucose-Capillary: 150 mg/dL — ABNORMAL HIGH (ref 70–99)
Glucose-Capillary: 132 mg/dL — ABNORMAL HIGH (ref 70–99)

## 2021-04-30 LAB — COMPREHENSIVE METABOLIC PANEL
ALT: 24 U/L (ref 0–44)
AST: 39 U/L (ref 15–41)
Albumin: 2.8 g/dL — ABNORMAL LOW (ref 3.5–5.0)
Alkaline Phosphatase: 116 U/L (ref 38–126)
Anion gap: 7 (ref 5–15)
BUN: 34 mg/dL — ABNORMAL HIGH (ref 8–23)
CO2: 25 mmol/L (ref 22–32)
Calcium: 8.6 mg/dL — ABNORMAL LOW (ref 8.9–10.3)
Chloride: 101 mmol/L (ref 98–111)
Creatinine, Ser: 1.93 mg/dL — ABNORMAL HIGH (ref 0.61–1.24)
GFR, Estimated: 36 mL/min — ABNORMAL LOW (ref >60)
Glucose, Bld: 147 mg/dL — ABNORMAL HIGH (ref 70–99)
Potassium: 5.3 mmol/L — ABNORMAL HIGH (ref 3.5–5.1)
Sodium: 133 mmol/L — ABNORMAL LOW (ref 135–145)
Total Bilirubin: 0.6 mg/dL (ref 0.3–1.2)
Total Protein: 6.1 g/dL — ABNORMAL LOW (ref 6.5–8.1)

## 2021-04-30 MED ORDER — SODIUM ZIRCONIUM CYCLOSILICATE 5 G PO PACK
5.0000 g | PACK | Freq: Once | ORAL | Status: AC
  Administered 2021-04-30: 5 g via ORAL
  Filled 2021-04-30: qty 1

## 2021-04-30 MED ORDER — ENSURE ENLIVE PO LIQD
237.0000 mL | Freq: Two times a day (BID) | ORAL | Status: DC
  Administered 2021-04-30 – 2021-05-02 (×5): 237 mL via ORAL

## 2021-04-30 NOTE — Evaluation (Signed)
Occupational Therapy Assessment and Plan  Patient Details  Name: Cesar Ayers MRN: [id number] Date of Birth: 1946-09-11  OT Diagnosis: abnormal posture and muscle weakness (generalized) Rehab Potential:   ELOS: 5-7 days   Today's Date: 04/30/2021 OT Individual Time: 9417-4081 OT Individual Time Calculation (min): 70 min     Hospital Problem: Principal Problem:   Debility   Past Medical History:  Past Medical History:  Diagnosis Date  . Benign prostatic hyperplasia (BPH) with urinary urgency   . Bradycardia   . Diverticulosis   . Food impaction of esophagus   . Hypothyroid   . OA (osteoarthritis) of knee    Past Surgical History:  Past Surgical History:  Procedure Laterality Date  . EP IMPLANTABLE DEVICE    . TOTAL KNEE ARTHROPLASTY Right     Assessment & Plan Clinical Impression: Cesar Ayers is a 75 year old male with history of bradycardia s/p PPM, diverticulosis, BPH w/ hesitancy, hypothyroidism, history of food impaction in the past;  who was admitted on [facility] from OSH on 03/30/2019 with esophageal rupture with extensive pneumomediastinum due to impacted piece of meat, extensive mediastinal/retroperitoneal/inguinal adenopathy and significant leukocytosis -179 with numerous blasts . He underwent repair of esophageal perforation with intercostal muscle flap, wash out and drainage by Dr. Lorrin Jackson as well as laparotomy with emergent EGD for FB removal as well as reduction of incarceration of incisional hernia and gastrostomy tube placement on the same day.  Hospital course complicated by septic shock, delirium, ileus, persistent diarrhea with abdominal distension, acute on chronic renal failure, fluid overload, full thickness coumellar wound from NGT treated with local measures per plastics as well as pain. He was started on dysphagia 1 diet ( not advanced due to esophageal stricture) as well as nocturnal tube feeds.   Hematology/oncology consulted and he was started on allopurinol  for TLS prophylaxis with plans to follow up on outpatient basis-->wife to schedule.   He was discharged to [facility] on 04/18/2021 for wound drainage/care and debility. Wound have healed well and diet advanced to D3 on 04/28/21. He has had issues with hyperglycemia, intermittent hyperkalemia as well as persistent leucocytosis. TSH elevated at 10.26 and synthroid increased to 88 mcg on 05/26.   Patient with resulting functional deficits with endurance and mobility.  Please see preadmission assessment earlier today as well. Patient transferred to CIR on 04/29/2021 .    Patient currently requires min with basic self-care skills secondary to muscle weakness, decreased cardiorespiratoy endurance, decreased coordination and decreased motor planning and decreased sitting balance, decreased standing balance, decreased postural control and decreased balance strategies.  Prior to hospitalization, patient could complete BADL with independent .  Patient will benefit from skilled intervention to decrease level of assist with basic self-care skills and increase independence with basic self-care skills prior to discharge home with care partner.  Anticipate patient will require intermittent supervision and follow up home health.  OT - End of Session Activity Tolerance: Tolerates 30+ min activity with multiple rests Endurance Deficit: Yes OT Assessment OT Patient demonstrates impairments in the following area(s): Balance;Endurance;Motor;Safety;Sensory OT Basic ADL's Functional Problem(s): Grooming;Bathing;Dressing;Toileting OT Transfers Functional Problem(s): Toilet;Tub/Shower OT Additional Impairment(s): None OT Plan OT Intensity: Minimum of 1-2 x/day, 45 to 90 minutes OT Frequency: 5 out of 7 days OT Duration/Estimated Length of Stay: 5-7 days OT Treatment/Interventions: Balance/vestibular training;Discharge planning;Pain management;Self Care/advanced ADL retraining;Therapeutic Activities;UE/LE Coordination  activities;Visual/perceptual remediation/compensation;Therapeutic Exercise;Skin care/wound managment;Patient/family education;Functional mobility training;Disease mangement/prevention;Community reintegration;Cognitive remediation/compensation;DME/adaptive equipment instruction;Neuromuscular re-education;Psychosocial support;UE/LE Strength taining/ROM;Wheelchair propulsion/positioning OT Self Feeding  Anticipated Outcome(s): no goal OT Basic Self-Care Anticipated Outcome(s): Supervision OT Toileting Anticipated Outcome(s): Supervision OT Bathroom Transfers Anticipated Outcome(s): Supervision OT Recommendation Patient destination: Home Follow Up Recommendations: Home health OT Equipment Recommended: 3 in 1 bedside comode;To be determined   OT Evaluation Precautions/Restrictions  Precautions Precautions: Fall Restrictions Weight Bearing Restrictions: No Pain Pain Assessment Pain Scale: 0-10 Pain Score: 0-No pain Home Living/Prior Functioning Home Living Family/patient expects to be discharged to:: Private residence Living Arrangements: Spouse/significant other Available Help at Discharge: Family,Available 24 hours/day Type of Home: House Home Access: Stairs to enter [employer] of Steps: 1 (per chart) Entrance Stairs-Rails: None (per chart) Home Layout: One level Bathroom Shower/Tub: Optometrist: Yes  Lives With: Spouse IADL History Current License: Yes 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 Prior Function Level of Independence: Independent with basic ADLs,Independent with gait,Independent with transfers Vision Baseline Vision/History: Wears glasses Wears Glasses: Reading only Patient Visual Report: No change from baseline Vision Assessment?: Yes Perception  Perception: Within Functional Limits Praxis Praxis: Intact Cognition Overall Cognitive Status: Within Functional Limits for  tasks assessed Arousal/Alertness: Awake/alert Orientation Level: Person;Place;Situation Person: Oriented Place: Oriented Situation: Oriented Year: 2022 Month: June Day of Week: Correct Memory: Appears intact Immediate Memory Recall: Sock;Blue;Bed Memory Recall Sock: Without Cue Memory Recall Blue: Without Cue Memory Recall Bed: Without Cue Attention: Focused;Sustained Focused Attention: Appears intact Sustained Attention: Appears intact Awareness: Appears intact Problem Solving: Appears intact Safety/Judgment: Appears intact Sensation Sensation Girgis Touch: Impaired Detail Peripheral sensation comments: hypersensitive digits and toes Coordination Gross Motor Movements are Fluid and Coordinated: No Fine Motor Movements are Fluid and Coordinated: Yes Coordination and Movement Description: 2/2 generalized weakness Motor  Motor Motor: Within Functional Limits Motor - Skilled Clinical Observations: WFL but generalized weakness limiting  Trunk/Postural Assessment  Cervical Assessment Cervical Assessment: Within Functional Limits Thoracic Assessment Thoracic Assessment: Exceptions to [facility] (rounded shoulders) Lumbar Assessment Lumbar Assessment: Exceptions to [facility] (posterior pelvic tilt) Postural Control Postural Control: Deficits on evaluation  Balance Balance Balance Assessed: Yes Static Sitting Balance Static Sitting - Balance Support: Feet supported Static Sitting - Level of Assistance: 5: Stand by assistance Dynamic Sitting Balance Dynamic Sitting - Balance Support: Feet supported;Left upper extremity supported Dynamic Sitting - Level of Assistance: 5: Stand by assistance Dynamic Sitting - Balance Activities: Lateral lean/weight shifting;Forward lean/weight shifting;Reaching for objects Static Standing Balance Static Standing - Balance Support: During functional activity;Bilateral upper extremity supported Static Standing - Level of Assistance: 5: Stand by  assistance Dynamic Standing Balance Dynamic Standing - Balance Support: During functional activity;Left upper extremity supported Dynamic Standing - Level of Assistance: 4: Min assist Dynamic Standing - Balance Activities: Lateral lean/weight shifting;Forward lean/weight shifting;Reaching for objects Extremity/Trunk Assessment RUE Assessment RUE Assessment: Exceptions to [facility] Active Range of Motion (AROM) Comments: shoulder limitations approx 90* shoulder flexion/abduction 2/2 arthritic limitations LUE Assessment LUE Assessment: Exceptions to [facility] Active Range of Motion (AROM) Comments: shoulder limitations approx 90* shoulder flexion/abduction 2/2 arthritic limitations  Care Tool Care Tool Self Care Eating    set up    Oral Care     set up    Bathing   Body parts bathed by patient: Right arm;Left arm;Chest;Abdomen;Front perineal area;Buttocks;Right upper leg;Left upper leg;Face;Left lower leg Body parts bathed by helper: Right lower leg   Assist Level: Minimal Assistance - Patient > 75%    Upper Body Dressing(including orthotics)   What is the patient wearing?: Button up shirt   Assist Level: Supervision/Verbal cueing  Lower Body Dressing (excluding footwear)   What is the patient wearing?: Pants Assist for lower body dressing: Minimal Assistance - Patient > 75%    Putting on/Taking off footwear   What is the patient wearing?: Non-skid slipper socks Assist for footwear: Supervision/Verbal cueing       Care Tool Toileting Toileting activity   Assist for toileting: Contact Guard/Touching assist     Care Tool Bed Mobility Roll left and right activity    Supervision    Sit to lying activity   Sit to lying assist level: Supervision/Verbal cueing    Lying to sitting edge of bed activity   Lying to sitting edge of bed assist level: Supervision/Verbal cueing     Care Tool Transfers Sit to stand transfer   Sit to stand assist level: Contact Guard/Touching assist     Chair/bed transfer   Chair/bed transfer assist level: Contact Guard/Touching assist     Toilet transfer   Assist Level: Contact Guard/Touching assist     Care Tool Cognition Expression of Ideas and Wants Expression of Ideas and Wants: Without difficulty (complex and basic) - expresses complex messages without difficulty and with speech that is clear and easy to understand   Understanding Verbal and Non-Verbal Content Understanding Verbal and Non-Verbal Content: Understands (complex and basic) - clear comprehension without cues or repetitions   Memory/Recall Ability *first 3 days only Memory/Recall Ability *first 3 days only: Current season;Location of own room;That he or she is in a hospital/hospital unit    Refer to Care Plan for [place] 1 OT Short Term Goal 1 (Week 1): STGs = LTGs d/t ELOS  Recommendations for other services: None    Skilled Therapeutic Intervention ADL ADL Eating: Not assessed Grooming: Setup Upper Body Bathing: Setup Where Assessed-Upper Body Bathing: Sitting at sink Lower Body Bathing: Minimal assistance Upper Body Dressing: Supervision/safety Lower Body Dressing: Minimal assistance Toileting: Contact guard Toilet Transfer: Contact guard Toilet Transfer Method: Ambulating Toilet Transfer Equipment: Bedside commode Mobility  Bed Mobility Bed Mobility: Rolling Left;Left Sidelying to Sit;Sit to Supine;Sit to Sidelying Left Rolling Left: Supervision/Verbal cueing Left Sidelying to Sit: Supervision/Verbal cueing Sit to Supine: Supervision/Verbal cueing Sit to Sidelying Left: Supervision/Verbal cueing Transfers Sit to Stand: Contact Guard/Touching assist Stand to Sit: Contact Guard/Touching assist   Skilled Interventions: Pt greeted at time of session supine in bed resting agreeable to OT session, discussed role and purpose of OT, pt agreeable. See above and below for detail.   Bed mobility Supevision overall, sit <>  stands and short distance functional mobility with CGA with RW and some stand pivots with HHA to from bed <> wheelchair. Set up at sink and UB/LB bathing Min A for RLE only d/t knee stiffness, able to wash periarea and buttocks in standing. UB dress Supervision for button up shirt and shorts with Min A to thread RLE only. Donned socks Supervision with figure four. Obtained wheelchair as well 18x18 with cushion for future sessions. Pt resting in supine alarm on call bell in reach.    Discharge Criteria: Patient will be discharged from OT if patient refuses treatment 3 consecutive times without medical reason, if treatment goals not met, if there is a change in medical status, if patient makes no progress towards goals or if patient is discharged from hospital.  The above assessment, treatment plan, treatment alternatives and goals were discussed and mutually agreed upon: by patient  Viona Gilmore 04/30/2021, 12:51 PM

## 2021-04-30 NOTE — Evaluation (Signed)
Physical Therapy Assessment and Plan  Patient Details  Name: Cesar Ayers MRN: [id number] Date of Birth: 31-Jan-1946  PT Diagnosis: Abnormality of gait, Difficulty walking and Pain in surgical site Rehab Potential: Good ELOS: 5-7 days   Today's Date: 04/30/2021 PT Individual Time: 1300-1400 PT Individual Time Calculation (min): 60 min    Hospital Problem: Principal Problem:   Cesar Ayers   Past Medical History:  Past Medical History:  Diagnosis Date  . Benign prostatic hyperplasia (BPH) with urinary urgency   . Bradycardia   . Diverticulosis   . Food impaction of esophagus   . Hypothyroid   . OA (osteoarthritis) of knee    Past Surgical History:  Past Surgical History:  Procedure Laterality Date  . EP IMPLANTABLE DEVICE    . TOTAL KNEE ARTHROPLASTY Right     Assessment & Plan Clinical Impression:  Cesar Ayers is a 75 year old male with history of bradycardia s/p PPM, diverticulosis, BPH w/ hesitancy, hypothyroidism, history of food impaction in the past;  who was admitted on [facility] from OSH on 03/30/2019 with esophageal rupture with extensive pneumomediastinum due to impacted piece of meat, extensive mediastinal/retroperitoneal/inguinal adenopathy and significant leukocytosis -179 with numerous blasts . He underwent repair of esophageal perforation with intercostal muscle flap, wash out and drainage by Dr. Lorrin Jackson as well as laparotomy with emergent EGD for FB removal as well as reduction of incarceration of incisional hernia and gastrostomy tube placement on the same day.  Hospital course complicated by septic shock, delirium, ileus, persistent diarrhea with abdominal distension, acute on chronic renal failure, fluid overload, full thickness coumellar wound from NGT treated with local measures per plastics as well as pain. He was started on dysphagia 1 diet ( not advanced due to esophageal stricture) as well as nocturnal tube feeds.   Hematology/oncology consulted and he was started  on allopurinol for TLS prophylaxis with plans to follow up on outpatient basis-->wife to schedule.   He was discharged to [facility] on 04/18/2021 for wound drainage/care and Cesar Ayers. Wound have healed well and diet advanced to D3 on 04/28/21. He has had issues with hyperglycemia, intermittent hyperkalemia as well as persistent leucocytosis. TSH elevated at 10.26 and synthroid increased to 88 mcg on 05/26.   Patient with resulting functional deficits with endurance and mobility.  Please see preadmission assessment earlier today as well. Patient transferred to CIR on 04/29/2021 .   Patient currently requires min with mobility secondary to decreased cardiorespiratoy endurance and decreased standing balance, decreased postural control and decreased balance strategies.  Prior to hospitalization, patient was independent  with mobility and lived with Spouse in a House home.  Home access is 1Stairs to enter.  Patient will benefit from skilled PT intervention to maximize safe functional mobility, minimize fall risk and decrease caregiver burden for planned discharge home with 24 hour supervision.  Anticipate patient will benefit from follow up [place] at discharge.  PT - End of Session Activity Tolerance: Tolerates 30+ min activity with multiple rests Endurance Deficit: Yes Endurance Deficit Description: frequent rest breaks during functional activity, fatigues quickly PT Assessment Rehab Potential (ACUTE/IP ONLY): Good PT Patient demonstrates impairments in the following area(s): Balance;Endurance;Pain;Safety;Sensory PT Transfers Functional Problem(s): Bed Mobility;Bed to Chair;Car;Furniture;Floor PT Locomotion Functional Problem(s): Ambulation;Wheelchair Mobility;Stairs PT Plan PT Intensity: Minimum of 1-2 x/day ,45 to 90 minutes PT Frequency: 5 out of 7 days PT Duration Estimated Length of Stay: 5-7 days PT Treatment/Interventions: Ambulation/gait training;Balance/vestibular training;Community  reintegration;Discharge planning;DME/adaptive equipment instruction;Functional mobility training;Pain management;Patient/family education;Stair training;Therapeutic Activities;Therapeutic Exercise;UE/LE  Strength taining/ROM;UE/LE Coordination activities PT Transfers Anticipated Outcome(s): Supervision PT Locomotion Anticipated Outcome(s): Supervision with LRAD PT Recommendation Follow Up Recommendations: Home health PT Patient destination: Home Equipment Recommended: Rolling walker with 5" wheels   PT Evaluation Precautions/Restrictions Precautions Precautions: Fall Restrictions Weight Bearing Restrictions: No Home Living/Prior Functioning Home Living Available Help at Discharge: Family;Available 24 hours/day Type of Home: House Home Access: Stairs to enter [employer] of Steps: 1 Entrance Stairs-Rails: None Home Layout: One level Bathroom Shower/Tub: Chiropodist: Standard Bathroom Accessibility: Yes  Lives With: Spouse Prior Function Level of Independence: Independent with gait;Independent with transfers  Able to Take Stairs?: Yes Driving: Yes Vocation: Retired Radiographer, therapeutic - History Baseline Vision: Wears glasses only for reading Perception Perception: Within Functional Limits Praxis Praxis: Intact  Cognition Overall Cognitive Status: Within Functional Limits for tasks assessed Arousal/Alertness: Awake/alert Orientation Level: Oriented X4 Attention: Focused;Sustained Focused Attention: Appears intact Sustained Attention: Appears intact Memory: Appears intact Immediate Memory Recall: Sock;Blue;Bed Memory Recall Sock: Without Cue Memory Recall Blue: Without Cue Memory Recall Bed: Without Cue Awareness: Appears intact Problem Solving: Appears intact Safety/Judgment: Appears intact Sensation Sensation [place] Touch: Impaired Detail Peripheral sensation comments: hypersensitive digits and toes Proprioception: Appears  Intact Coordination Gross Motor Movements are Fluid and Coordinated: No Fine Motor Movements are Fluid and Coordinated: Yes Coordination and Movement Description: 2/2 generalized weakness Motor  Motor Motor: Within Functional Limits Motor - Skilled Clinical Observations: WFL but generalized weakness limiting  Trunk/Postural Assessment  Cervical Assessment Cervical Assessment: Within Functional Limits Thoracic Assessment Thoracic Assessment: Exceptions to [facility] (rounded shoulders) Lumbar Assessment Lumbar Assessment: Exceptions to [facility] (posterior pelvic tilt) Postural Control Postural Control: Deficits on evaluation  Balance Balance Balance Assessed: Yes Standardized Balance Assessment Standardized Balance Assessment: Berg Balance Test Berg Balance Test Sit to Stand: Needs minimal aid to stand or to stabilize Standing Unsupported: Able to stand 2 minutes with supervision Sitting with Back Unsupported but Feet Supported on Floor or Stool: Able to sit safely and securely 2 minutes Stand to Sit: Sits safely with minimal use of hands Transfers: Needs one person to assist Standing Unsupported with Eyes Closed: Able to stand 10 seconds with supervision Standing Ubsupported with Feet Together: Able to place feet together independently and stand for 1 minute with supervision From Standing, Reach Forward with Outstretched Arm: Loses balance while trying/requires external support From Standing Position, Pick up Object from Floor: Unable to try/needs assist to keep balance From Standing Position, Turn to Look Behind Over each Shoulder: Needs supervision when turning Turn 360 Degrees: Needs assistance while turning Standing Unsupported, Alternately Place Feet on Step/Stool: Needs assistance to keep from falling or unable to try Standing Unsupported, One Foot in Front: Needs help to step but can hold 15 seconds Standing on One Leg: Unable to try or needs assist to prevent fall Total Score:  21 Static Sitting Balance Static Sitting - Balance Support: Feet supported Static Sitting - Level of Assistance: 5: Stand by assistance Dynamic Sitting Balance Dynamic Sitting - Balance Support: Feet supported Dynamic Sitting - Level of Assistance: 5: Stand by assistance Dynamic Sitting - Balance Activities: Lateral lean/weight shifting;Forward lean/weight shifting;Reaching for objects Static Standing Balance Static Standing - Balance Support: No upper extremity supported;During functional activity Static Standing - Level of Assistance: 5: Stand by assistance Dynamic Standing Balance Dynamic Standing - Balance Support: No upper extremity supported;During functional activity Dynamic Standing - Level of Assistance: 4: Min assist Dynamic Standing - Balance Activities: Lateral lean/weight shifting;Forward lean/weight shifting;Reaching for objects  Extremity Assessment  RUE Assessment RUE Assessment: Exceptions to [facility] Active Range of Motion (AROM) Comments: shoulder limitations approx 90* shoulder flexion/abduction 2/2 arthritic limitations LUE Assessment LUE Assessment: Exceptions to [facility] Active Range of Motion (AROM) Comments: shoulder limitations approx 90* shoulder flexion/abduction 2/2 arthritic limitations RLE Assessment RLE Assessment: Within Functional Limits General Strength Comments: 5/5 grossly LLE Assessment LLE Assessment: Within Functional Limits General Strength Comments: 5/5 grossly  Care Tool Care Tool Bed Mobility Roll left and right activity   Roll left and right assist level: Supervision/Verbal cueing    Sit to lying activity   Sit to lying assist level: Supervision/Verbal cueing    Lying to sitting edge of bed activity   Lying to sitting edge of bed assist level: Supervision/Verbal cueing     Care Tool Transfers Sit to stand transfer   Sit to stand assist level: Contact Guard/Touching assist    Chair/bed transfer   Chair/bed transfer assist level: Contact  Guard/Touching assist     Toilet transfer   Assist Level: Contact Guard/Touching assist    Car transfer   Car transfer assist level: Contact Guard/Touching assist      Care Tool Locomotion Ambulation   Assist level: Minimal Assistance - Patient > 75% Assistive device: Hand held assist Max distance: 80'  Walk 10 feet activity   Assist level: Minimal Assistance - Patient > 75% Assistive device: Hand held assist   Walk 50 feet with 2 turns activity   Assist level: Minimal Assistance - Patient > 75% Assistive device: Hand held assist  Walk 150 feet activity Walk 150 feet activity did not occur: Safety/medical concerns      Walk 10 feet on uneven surfaces activity Walk 10 feet on uneven surfaces activity did not occur: Safety/medical concerns      Stairs   Assist level: Contact Guard/Touching assist Stairs assistive device: 2 hand rails Max number of stairs: 12  Walk up/down 1 step activity   Walk up/down 1 step (curb) assist level: Contact Guard/Touching assist Walk up/down 1 step or curb assistive device: 2 hand rails    Walk up/down 4 steps activity Walk up/down 4 steps assist level: Contact Guard/Touching assist Walk up/down 4 steps assistive device: 2 hand rails  Walk up/down 12 steps activity   Walk up/down 12 steps assist level: Contact Guard/Touching assist Walk up/down 12 steps assistive device: 2 hand rails  Pick up small objects from floor Pick up small object from the floor (from standing position) activity did not occur: Safety/medical concerns      Wheelchair Will patient use wheelchair at discharge?: No          Wheel 50 feet with 2 turns activity      Wheel 150 feet activity        Refer to Care Plan for Long Term Goals  SHORT TERM GOAL WEEK 1 PT Short Term Goal 1 (Week 1): =LTG due to ELOS  Recommendations for other services: None   Skilled Therapeutic Intervention Evaluation completed (see details above and below) with education on PT POC and  goals and individual treatment initiated with focus on functional transfer, gait and balance assessment. Pt received supine in bed, agreeable to PT session. Bed mobility Supervision. No complaints of pain at rest, has onset of some abdominal pain with mobility from incision, improves at rest. Sit to stand and stand pivot transfer with CGA and no AD. Ambulation x 80 ft with no AD and min HHA required due to path deviation and ataxic  gait pattern. Introduced RW. Ambulation x 100 ft with RW and CGA for balance. Car transfer with CGA and no AD. Ascend/descend 12 x 6" stairs with 2 handrails and CGA for balance. Patient demonstrates increased fall risk as noted by score of  21/56 on Berg Balance Scale.  (<36= high risk for falls, close to 100%; 37-45 significant >80%; 46-51 moderate >50%; 52-55 lower >25%). Reviewed score and functional implications. Pt returned to bed at end of session, left supine with needs in reach and bed alarm in place.  Mobility Bed Mobility Bed Mobility: Rolling Left;Left Sidelying to Sit;Sit to Supine;Sit to Sidelying Left Rolling Left: Supervision/Verbal cueing Left Sidelying to Sit: Supervision/Verbal cueing Sit to Supine: Supervision/Verbal cueing Sit to Sidelying Left: Supervision/Verbal cueing Transfers Transfers: Sit to Stand;Stand Pivot Transfers;Stand to Sit Sit to Stand: Contact Guard/Touching assist Stand to Sit: Contact Guard/Touching assist Stand Pivot Transfers: Contact Guard/Touching assist Transfer (Assistive device): 1 person hand held assist Locomotion  Gait Gait Distance (Feet): 80 Feet Assistive device: 1 person hand held assist Gait Gait Pattern: Impaired (path deviation, ataxic) Gait velocity: decreased Stairs / Additional Locomotion Stairs: Yes Stairs Assistance: Contact Guard/Touching assist Stair Management Technique: Two rails;Step to pattern Number of Stairs: 12 Height of Stairs: 6 Wheelchair Mobility Wheelchair Mobility: No   Discharge  Criteria: Patient will be discharged from PT if patient refuses treatment 3 consecutive times without medical reason, if treatment goals not met, if there is a change in medical status, if patient makes no progress towards goals or if patient is discharged from hospital.  The above assessment, treatment plan, treatment alternatives and goals were discussed and mutually agreed upon: by patient   Excell Seltzer, PT, DPT, CSRS 04/30/2021, 3:49 PM

## 2021-04-30 NOTE — Plan of Care (Signed)
  Problem: RH Balance Goal: LTG Patient will maintain dynamic standing with ADLs (OT) Description: LTG:  Patient will maintain dynamic standing balance with assist during activities of daily living (OT)  Flowsheets (Taken 04/30/2021 1359) LTG: Pt will maintain dynamic standing balance during ADLs with: Supervision/Verbal cueing   Problem: Sit to Stand Goal: LTG:  Patient will perform sit to stand in prep for activites of daily living with assistance level (OT) Description: LTG:  Patient will perform sit to stand in prep for activites of daily living with assistance level (OT) Flowsheets (Taken 04/30/2021 1359) LTG: PT will perform sit to stand in prep for activites of daily living with assistance level: Supervision/Verbal cueing   Problem: RH Grooming Goal: LTG Patient will perform grooming w/assist,cues/equip (OT) Description: LTG: Patient will perform grooming with assist, with/without cues using equipment (OT) Flowsheets (Taken 04/30/2021 1359) LTG: Pt will perform grooming with assistance level of: Set up assist    Problem: RH Bathing Goal: LTG Patient will bathe all body parts with assist levels (OT) Description: LTG: Patient will bathe all body parts with assist levels (OT) Flowsheets (Taken 04/30/2021 1359) LTG: Pt will perform bathing with assistance level/cueing: Supervision/Verbal cueing   Problem: RH Dressing Goal: LTG Patient will perform upper body dressing (OT) Description: LTG Patient will perform upper body dressing with assist, with/without cues (OT). Flowsheets (Taken 04/30/2021 1359) LTG: Pt will perform upper body dressing with assistance level of: Set up assist Goal: LTG Patient will perform lower body dressing w/assist (OT) Description: LTG: Patient will perform lower body dressing with assist, with/without cues in positioning using equipment (OT) Flowsheets (Taken 04/30/2021 1359) LTG: Pt will perform lower body dressing with assistance level of: Supervision/Verbal cueing    Problem: RH Toileting Goal: LTG Patient will perform toileting task (3/3 steps) with assistance level (OT) Description: LTG: Patient will perform toileting task (3/3 steps) with assistance level (OT)  Flowsheets (Taken 04/30/2021 1359) LTG: Pt will perform toileting task (3/3 steps) with assistance level: Supervision/Verbal cueing   Problem: RH Toilet Transfers Goal: LTG Patient will perform toilet transfers w/assist (OT) Description: LTG: Patient will perform toilet transfers with assist, with/without cues using equipment (OT) Flowsheets (Taken 04/30/2021 1359) LTG: Pt will perform toilet transfers with assistance level of: Supervision/Verbal cueing

## 2021-04-30 NOTE — Progress Notes (Signed)
Inpatient Rehabilitation  Patient information reviewed and entered into eRehab system by Jaqualin Serpa Markese Bloxham, OTR/L.   Information including medical coding, functional ability and quality indicators will be reviewed and updated through discharge.    

## 2021-04-30 NOTE — Progress Notes (Signed)
Inpatient Rehabilitation Medication Review by a Pharmacist  A complete drug regimen review was completed for this patient to identify any potential clinically significant medication issues.  Clinically significant medication issues were identified:  no  Check AMION for pharmacist assigned to patient if future medication questions/issues arise during this admission.  Pharmacist comments:   Time spent performing this drug regimen review (minutes):  44min   Cesar Ayers S. Alford Highland, PharmD, BCPS Clinical Staff Pharmacist [email or web address] [place] 04/30/2021 8:50 AM

## 2021-04-30 NOTE — Evaluation (Signed)
Speech Language Pathology Assessment and Plan  Patient Details  Name: Cesar Ayers MRN: [id number] Date of Birth: 04-05-1946  SLP Diagnosis: Dysphagia  Rehab Potential:  N/A for ST ELOS:   N/A for ST   Today's Date: 04/30/2021 SLP Individual Time: 1100-1145 SLP Individual Time Calculation (min): 45 min   Hospital Problem: Principal Problem:   Debility  Past Medical History:  Past Medical History:  Diagnosis Date  . Benign prostatic hyperplasia (BPH) with urinary urgency   . Bradycardia   . Diverticulosis   . Food impaction of esophagus   . Hypothyroid   . OA (osteoarthritis) of knee    Past Surgical History:  Past Surgical History:  Procedure Laterality Date  . EP IMPLANTABLE DEVICE    . TOTAL KNEE ARTHROPLASTY Right     Assessment / Plan / Recommendation  Cesar Ayers is a 75 year old male with history of bradycardia s/p PPM, diverticulosis, BPH w/ hesitancy, hypothyroidism, history of food impaction in the past; who was admitted on [facility] from OSH on 03/30/2019 with esophageal rupture with extensive pneumomediastinum due to impacted piece of meat, extensive mediastinal/retroperitoneal/inguinal adenopathy and significant leukocytosis -179 with numerous blasts . He underwent repair of esophageal perforation with intercostal muscle flap, wash out and drainage by Dr. Lorrin Jackson as well as laparotomy with emergent EGD for FB removal as well as reduction of incarceration of incisional hernia and gastrostomy tube placement on the same day. Hospital course complicated by septic shock, delirium, ileus, persistent diarrhea with abdominal distension, acute on chronic renal failure, fluid overload, full thickness coumellar wound from NGT treated with local measures per plastics as well as pain. He was started on dysphagia 1 diet ( not advanced due to esophageal stricture) as well as nocturnal tube feeds. Hematology/oncology consulted and he was started on allopurinol for TLS prophylaxis  with plans to follow up on outpatient basis-->wife to schedule.   He was discharged to [facility] on 04/18/2021 for wound drainage/care and debility. Wound have healed well and diet advanced to D3 on 04/28/21. He has had issues with hyperglycemia, intermittent hyperkalemia as well as persistent leucocytosis. TSH elevated at 10.26 and synthroid increased to 88 mcg on 05/26. Patient with resulting functional deficits with endurance and mobility. Please see preadmission assessment earlier today as well. Patient transferred to CIR on 04/29/2021 .    Clinical Impression Patient presents with what appears to be a primary esophageal dysphagia but with oropharyngeal phase of swallow WFL. No overt s/s aspiration or penetration obeserved with thin liquids or small amount of soft solids (Dys 3). Patient was observed to belch frequently after liquid or solid PO intake. As per chart review and SLP consulting with the SLP who worked with him when patient of [facility], patient has been tolerating Dys 3 solids and thin liquids diet after initially being NPO with PEG, then improving to Dys 1, thin liquids following MBS. Secondary to patient's esophageal dysphagia, SLP will not make changes to patient's solid food textures and will defer any plans to upgrade to regular solids to GI MD. SLP is not recommending CIR level servies at this time.  Skilled Therapeutic Interventions          Bedside Swallow Evaluation  SLP Assessment  Patient does not need any further Speech [place] Pathology Services    Recommendations  Recommendations for Other Services: Neuropsych consult Patient destination: Home Follow up Recommendations: None Equipment Recommended: None recommended by SLP    SLP Frequency   N/A  SLP  Duration  SLP Intensity  SLP Treatment/Interventions  N/A    N/A    N/A    Pain Pain Assessment Pain Scale: 0-10 Pain Score: 0-No pain  Prior Functioning Cognitive/Linguistic Baseline: Within  functional limits Type of Home: House  Lives With: Spouse Available Help at Discharge: Family;Available 24 hours/day Vocation: Retired  Programmer, systems Overall Cognitive Status: Within Functional Limits for tasks assessed Arousal/Alertness: Awake/alert Orientation Level: Oriented X4 Attention: Focused;Sustained Focused Attention: Appears intact Sustained Attention: Appears intact Memory: Appears intact Immediate Memory Recall: Sock;Blue;Bed Memory Recall Sock: Without Cue Memory Recall Blue: Without Cue Memory Recall Bed: Without Cue Awareness: Appears intact Problem Solving: Appears intact Safety/Judgment: Appears intact  Comprehension Auditory Comprehension Overall Auditory Comprehension: Appears within functional limits for tasks assessed Expression Expression Primary Mode of Expression: Verbal Verbal Expression Overall Verbal Expression: Appears within functional limits for tasks assessed Oral Motor Oral Motor/Sensory Function Overall Oral Motor/Sensory Function: Within functional limits Motor Speech Overall Motor Speech: Appears within functional limits for tasks assessed  Care Tool Care Tool Cognition Expression of Ideas and Wants Expression of Ideas and Wants: Without difficulty (complex and basic) - expresses complex messages without difficulty and with speech that is clear and easy to understand   Understanding Verbal and Non-Verbal Content Understanding Verbal and Non-Verbal Content: Understands (complex and basic) - clear comprehension without cues or repetitions   Memory/Recall Ability *first 3 days only Memory/Recall Ability *first 3 days only: Current season;That he or she is in a hospital/hospital unit;Location of own room      Bedside Swallowing Assessment General Date of Onset: 03/29/21 Previous Swallow Assessment: during stay at [place] Prior to this Study: Dysphagia 3 (soft);Thin liquids Temperature Spikes Noted:  No Respiratory Status: Room air History of Recent Intubation: No Behavior/Cognition: Alert;Cooperative;Pleasant mood Oral Cavity - Dentition: Edentulous Self-Feeding Abilities: Able to feed self Patient Positioning: Upright in bed Baseline Vocal Quality: Normal Volitional Cough: Strong Volitional Swallow: Able to elicit  Oral Care Assessment   Ice Chips Ice chips: Not tested Thin Liquid Thin Liquid: Within functional limits Presentation: Straw;Cup;Self Fed Nectar Thick  NT Honey Thick  NT Puree  NT Solid Solid: Within functional limits Presentation: Self Fed Oral Phase Functional Implications: Other (comment) Other Comments: Patient did not exhibit significant difficulty with soft solids (limited intake overall with lunch meal). Regular solids not trialed secondary to patient's esophageal dysphagia with h/o food impaction (steak) BSE Assessment Suspected Esophageal Findings Suspected Esophageal Findings: Belching Risk for Aspiration Impact on safety and function: Mild aspiration risk Other Related Risk Factors: History of esophageal-related issues  Short Term Goals: No short term goals set  Refer to Care Plan for Long Term Goals  Recommendations for other services: Neuropsych  Discharge Criteria: Patient will be discharged from SLP if patient refuses treatment 3 consecutive times without medical reason, if treatment goals not met, if there is a change in medical status, if patient makes no progress towards goals or if patient is discharged from hospital.  The above assessment, treatment plan, treatment alternatives and goals were discussed and mutually agreed upon: by patient   Sonia Baller, MA, CCC-SLP Speech Therapy

## 2021-04-30 NOTE — Progress Notes (Signed)
PROGRESS NOTE   Subjective/Complaints:  Pt reports stomach hurting/sore- where has PEG.   Bowels working well per pt- solid- LBM this AM  Not using IV in R hand.  Admits not taking pain meds- explained would rather he take them WHEN needed.    ROS:  Pt denies SOB, abd pain, CP, N/V/C/D, and vision changes   Objective:   No results found. Recent Labs    04/30/21 0510  WBC 206.1*  HGB 9.8*  HCT 31.7*  PLT 253   Recent Labs    04/29/21 0453 04/30/21 0510  NA  --  133*  K 4.3 5.3*  CL  --  101  CO2  --  25  GLUCOSE  --  147*  BUN  --  34*  CREATININE  --  1.93*  CALCIUM  --  8.6*    Intake/Output Summary (Last 24 hours) at 04/30/2021 7096 Last data filed at 04/30/2021 0743 Gross per 24 hour  Intake 200 ml  Output 425 ml  Net -225 ml        Physical Exam: Vital Signs Blood pressure 132/68, pulse 79, temperature 98.2 F (36.8 C), temperature source Oral, resp. rate 16, height 5\' 7"  (1.702 m), weight 87.4 kg, SpO2 94 %.   Physical Exam  General: awake, alert, appropriate, laying supine in bed; a little frail appearing; NAD HENT: conjugate gaze; oropharynx moist CV: regular rate and rhythm; no JVD Pulmonary: CTA B/L; no W/R/R- good air movement GI: soft, NT, ND, (+)BS; (+)PEG in place; a little sore/but not severe with palpation Psychiatric: appropriate; interactive Neurological: alert;   Musculoskeletal:     Cervical back: Normal range of motion and neck supple.     Comments: No edema or tenderness in extremities  Skin:    General: Skin is warm and dry.     Comments: Left forehead with nickel size ulcerated lesion. Large dry scab nasal septum.   Neurological:     Mental Status: He is alert.     Comments: Alert HOH Motor: 4+/5 throughout  Psychiatric:        Mood and Affect: Mood normal.        Behavior: Behavior normal.    Assessment/Plan: 1. Functional deficits which require 3+ hours  per day of interdisciplinary therapy in a comprehensive inpatient rehab setting.  Physiatrist is providing close team supervision and 24 hour management of active medical problems listed below.  Physiatrist and rehab team continue to assess barriers to discharge/monitor patient progress toward functional and medical goals  Care Tool:  Bathing              Bathing assist       Upper Body Dressing/Undressing Upper body dressing   What is the patient wearing?: Hospital gown only    Upper body assist Assist Level: Minimal Assistance - Patient > 75%    Lower Body Dressing/Undressing Lower body dressing            Lower body assist       Toileting Toileting    Toileting assist Assist for toileting: Contact Guard/Touching assist (urinal)     Transfers Chair/bed transfer  Transfers assist  Chair/bed transfer assist level: Minimal Assistance - Patient > 75%     Locomotion Ambulation   Ambulation assist              Walk 10 feet activity   Assist           Walk 50 feet activity   Assist           Walk 150 feet activity   Assist           Walk 10 feet on uneven surface  activity   Assist           Wheelchair     Assist               Wheelchair 50 feet with 2 turns activity    Assist            Wheelchair 150 feet activity     Assist          Blood pressure 132/68, pulse 79, temperature 98.2 F (36.8 C), temperature source Oral, resp. rate 16, height 5\' 7"  (1.702 m), weight 87.4 kg, SpO2 94 %.  Medical Problem List and Plan: 1.  Deficits with mobility, endurance secondary to debility             -patient may not shower             -ELOS/Goals: 5-7 days/mod I             Admit to CIR  -con't PT and OT- per SLP- swallowing issues are esophageal and cannot help 2.  Antithrombotics: -DVT/anticoagulation:  Pharmaceutical: Lovenox             -antiplatelet therapy: N/a 3. Pain Management:               Oxycodone as needed  6/2- pt not taking- suggested taking pain meds to help him participate in therapy.  4. Mood: LCSW to follow for evaluation and support.              -antipsychotic agents: N/a 5. Neuropsych: This patient is capable of making decisions on his own behalf. 6. Skin/Wound Care:              Routine Feeding tube care.              Continue water flushes per tube.  7. Fluids/Electrolytes/Nutrition: Monitor I/O. Check lytes in am.  8.  Hyperkalemia: Question due to tube feeds/CKD.              6/2- K+ 5.3- will give a dose of Lokelma and recheck in AM 9.  CLL:             WBCs 187.8 on 5/30             Follow-up as outpatient [facility] attempting to set F/u)  6/2- WBC up to 206k- will check with Heme/Onc when to get concerned?             Continue to monitor 10.  Acute blood loss anemia             Hemoglobin 9.9 on 5/30             CBC ordered for tomorrow 11. H/o esophageal stricture:             Tolerating Dysphagia 3, thins with good intake.              Add intermittent supervision at meals  6/2- SLp doesn't feel there's a  reason to fallow- since esophageal issues 12. T2DM- diet controlled.             HbA1c ordered for tomorrow AM  6/2- BGs are mid 100s; A1c wasn't done- will reorder for AM             Continue to monitor BS ac/hs 12. CKD: Baseline SCr 1.3 per records review             SCr improving from 2.0-->1.83.              CMP ordered for tomorrow 13. Hypothyroid: Was on 75 mcg/day PTA.              -- Synthroid increased to 88 mcg on 05/26.      LOS: 1 days A FACE TO FACE EVALUATION WAS PERFORMED  Cesar Ayers 04/30/2021, 9:27 AM

## 2021-04-30 NOTE — Progress Notes (Signed)
Initial Nutrition Assessment  DOCUMENTATION CODES:   Not applicable  INTERVENTION:  Provide Ensure Enlive po BID, each supplement provides 350 kcal and 20 grams of protein  Continue 30 ml Prosource plus po BID, each supplement provides 100 kcal and 15 grams of protein.   Encourage adequate PO intake.   NUTRITION DIAGNOSIS:   Increased nutrient needs related to  (therapy) as evidenced by estimated needs.  GOAL:   Patient will meet greater than or equal to 90% of their needs  MONITOR:   PO intake,Supplement acceptance,Skin,Weight trends,Labs,I & O's  REASON FOR ASSESSMENT:   Consult Calorie Count  ASSESSMENT:   75 year old male with history of bradycardia s/p PPM, diverticulosis, BPH w/ hesitancy, hypothyroidism, history of food impaction in the past;  who was admitted on [facility] from OSH on 03/29/2021 with esophageal rupture with extensive pneumomediastinum due to impacted piece of meat, extensive mediastinal/retroperitoneal/inguinal adenopathy. He underwent repair of esophageal perforation with intercostal muscle flap, wash out and drainage and underwent laparotomy with emergent EGD for FB removal as well as reduction of incarceration of incisional hernia and gastrostomy tube placement. Pt with resulting functional deficits with endurance and mobility admitted to CIR.  Pt unavailable during attempted time of visit. RD unable to obtain pt nutrition history at this time. Pt currently on a dysphagia 3 diet with thin liquids. Meal completion 100%. RD consulted for calorie count. RD to follow up with calorie count results tomorrow. RD to additionally order Ensure to aid in caloric and protein needs.   Unable to complete Nutrition-Focused physical exam at this time.   Labs and medications reviewed.   Diet Order:   Diet Order            DIET DYS 3 Room service appropriate? Yes; Fluid consistency: Thin  Diet effective now                 EDUCATION NEEDS:   Not appropriate for  education at this time  Skin:  Skin Assessment: Reviewed RN Assessment  Last BM:  6/2  Height:   Ht Readings from Last 1 Encounters:  04/29/21 5\' 7"  (1.702 m)    Weight:   Wt Readings from Last 1 Encounters:  04/29/21 87.4 kg   BMI:  Body mass index is 30.18 kg/m.  Estimated Nutritional Needs:   Kcal:  1900-2100  Protein:  90-105 grams  Fluid:  >/= 1.9 L/day  Cesar Parker, MS, RD, LDN RD pager number/after hours weekend pager number on Amion.

## 2021-05-01 LAB — BASIC METABOLIC PANEL
Anion gap: 8 (ref 5–15)
BUN: 30 mg/dL — ABNORMAL HIGH (ref 8–23)
CO2: 23 mmol/L (ref 22–32)
Calcium: 8.7 mg/dL — ABNORMAL LOW (ref 8.9–10.3)
Chloride: 104 mmol/L (ref 98–111)
Creatinine, Ser: 1.94 mg/dL — ABNORMAL HIGH (ref 0.61–1.24)
GFR, Estimated: 36 mL/min — ABNORMAL LOW (ref >60)
Glucose, Bld: 137 mg/dL — ABNORMAL HIGH (ref 70–99)
Potassium: 5.4 mmol/L — ABNORMAL HIGH (ref 3.5–5.1)
Sodium: 135 mmol/L (ref 135–145)

## 2021-05-01 LAB — GLUCOSE, CAPILLARY
Glucose-Capillary: 145 mg/dL — ABNORMAL HIGH (ref 70–99)
Glucose-Capillary: 141 mg/dL — ABNORMAL HIGH (ref 70–99)
Glucose-Capillary: 137 mg/dL — ABNORMAL HIGH (ref 70–99)
Glucose-Capillary: 145 mg/dL — ABNORMAL HIGH (ref 70–99)

## 2021-05-01 LAB — HEMOGLOBIN A1C
Hgb A1c MFr Bld: 7.2 % — ABNORMAL HIGH (ref 4.8–5.6)
Hgb A1c MFr Bld: 7.1 % — ABNORMAL HIGH (ref 4.8–5.6)
Mean Plasma Glucose: 160 mg/dL
Mean Plasma Glucose: 157 mg/dL

## 2021-05-01 MED ORDER — SODIUM ZIRCONIUM CYCLOSILICATE 10 G PO PACK
10.0000 g | PACK | Freq: Once | ORAL | Status: AC
  Administered 2021-05-01: 10 g via ORAL
  Filled 2021-05-01: qty 1

## 2021-05-01 NOTE — IPOC Note (Signed)
Overall Plan of Care [facility]) Patient Details Name: Cesar Ayers MRN: [id number] DOB: 1946-08-31  Admitting Diagnosis: [place] [facility] Problems: Principal Problem:   Debility     Functional Problem List: Nursing Behavior,Edema,Endurance,Medication Management,Nutrition,Pain,Safety,Skin Integrity  PT Balance,Endurance,Pain,Safety,Sensory  OT Balance,Endurance,Motor,Safety,Sensory  SLP    TR         Basic ADL's: OT Grooming,Bathing,Dressing,Toileting     Advanced  ADL's: OT       Transfers: PT Bed Mobility,Bed to [place]  OT Toilet,Tub/Shower     Locomotion: PT Ambulation,Wheelchair Mobility,Stairs     Additional Impairments: OT None  SLP        TR      Anticipated Outcomes Item Anticipated Outcome  Self Feeding no goal  Swallowing      Basic self-care  Supervision  Insurance underwriter Transfers Supervision  Bowel/Bladder  n/a  Transfers  Supervision  Locomotion  Supervision with LRAD  Communication     Cognition     Pain  < 3  Safety/Judgment  Mod I and no falls   Therapy Plan: PT Intensity: Minimum of 1-2 x/day ,45 to 90 minutes PT Frequency: 5 out of 7 days PT Duration Estimated Length of Stay: 5-7 days OT Intensity: Minimum of 1-2 x/day, 45 to 90 minutes OT Frequency: 5 out of 7 days OT Duration/Estimated Length of Stay: 5-7 days     Due to the current state of emergency, patients may not be receiving their 3-hours of Medicare-mandated therapy.   Team Interventions: Nursing Interventions Patient/Family Education,Disease Management/Prevention,Pain Management,Medication Management,Skin Care/Wound Management,Discharge Planning,Psychosocial Support  PT interventions Ambulation/gait training,Balance/vestibular training,Community reintegration,Discharge planning,DME/adaptive equipment instruction,Functional mobility training,Pain management,Patient/family education,Stair training,Therapeutic  Activities,Therapeutic Exercise,UE/LE Strength taining/ROM,UE/LE Coordination activities  OT Interventions Balance/vestibular training,Discharge planning,Pain management,Self Care/advanced ADL retraining,Therapeutic Activities,UE/LE Coordination activities,Visual/perceptual remediation/compensation,Therapeutic Exercise,Skin care/wound managment,Patient/family education,Functional mobility training,Disease mangement/prevention,Community reintegration,Cognitive remediation/compensation,DME/adaptive equipment instruction,Neuromuscular re-education,Psychosocial support,UE/LE Strength taining/ROM,Wheelchair propulsion/positioning  SLP Interventions    TR Interventions    SW/CM Interventions Discharge Planning,Psychosocial Support,Patient/Family Education   Barriers to Discharge MD  Medical stability, Home enviroment access/loayout, Wound care, Lack of/limited family support, Weight, Nutritional means and need to get PEG out  Nursing Decreased caregiver support,Wound Care,Lack of/limited family support,Weight,Medication compliance,Behavior,Nutrition means    PT      OT      SLP      SW Decreased caregiver support,Lack of/limited family support     Team Discharge Planning: Destination: PT-Home ,OT- Home , SLP-Home Projected Follow-up: PT-Home health PT, OT-  Home health OT, SLP-None Projected Equipment Needs: PT-Rolling walker with 5" wheels, OT- 3 in 1 bedside comode,To be determined, SLP-None recommended by SLP Equipment Details: PT- , OT-  Patient/family involved in discharge planning: PT- Patient,  OT-Patient, SLP-Patient  MD ELOS: 5-7 days Medical Rehab Prognosis:  Excellent Assessment: Pt is a 75 yr old male with severe debility due to esophageal perforation and extended hospital stay since March- He's also has new Dx of CLL- as well as Hyperkalemia- and will recheck Monday- might need to call Heme/Onc if WBC keeps going up- also ordering A1c.  Goals supervision by d/c.     See Team  Conference Notes for weekly updates to the plan of care

## 2021-05-01 NOTE — Progress Notes (Signed)
Physical Therapy Session Note  Patient Details  Name: Cesar Ayers MRN: [id number] Date of Birth: 03-Oct-1946  Today's Date: 05/01/2021 PT Individual Time: 1300-1409 PT Individual Time Calculation (min): 69 min   Short Term Goals: Week 1:  PT Short Term Goal 1 (Week 1): =LTG due to ELOS  Skilled Therapeutic Interventions/Progress Updates:   Received pt semi-reclined in bed, pt agreeable to therapy, and reported pain at peg tube site (unrated) but declined any pain interventions. Session with emphasis on functional mobility/transfers, generalized strengthening, dynamic standing balance/coordination, ambulation, and improved activity tolerance. Pt transferred supine<>sitting EOB with supervision and donned socks and shoes with supervision. Pt transferred bed<>WC stand<>pivot without AD and CGA. Pt transported outside to entrance of [place] in WC total A for time management purposes and ambulated 13ft x 1 and 138ft x 1 with RW and supervision over uneven surfaces (concrete) and up/down mild slopes. Pt demonstrated flexed trunk, narrow BOS, and downward gaze with mild SOB afterwards. Pt performed the following exercises with supervision and verbal cues for technique: -seated LAQ x12 -seated hip flexion x12 -standing marching x10 bilaterally with BUE support on RW -standing heel raises x15 with BUE support on RW -squats x10 with BUE support on RW -standing hip abduction x10 bilaterally with BUE support on RW Pt transported to 4W dayroom in WC total A and transferred on/off Nustep without AD and CGA. Pt performed BUE/LE strengthening on Nustep at workload 3 for 8 minutes with 2 rest breaks for a total of 305 steps for improved cardiovascular endurance. Pt politely declined ambulating any further due to fatigue and requested to return to room to rest prior to last session. Pt transported back to room in [facility] total A and transferred WC<>bed stand<>pivot with CGA and sit<>semi-reclined with supervision. Concluded  session with pt semi-reclined in bed, needs within reach, and bed alarm on. Provided pt with fresh drinks.   Therapy Documentation Precautions:  Precautions Precautions: Fall Restrictions Weight Bearing Restrictions: No  Therapy/Group: Individual Therapy Alfonse Alpers PT, DPT   05/01/2021, 7:34 AM

## 2021-05-01 NOTE — Progress Notes (Signed)
Inpatient Rehabilitation Care Coordinator Assessment and Plan Patient Details  Name: Cesar Ayers MRN: [id number] Date of Birth: [birth year]  Today's Date: 05/01/2021  Hospital Problems: Principal Problem:   Debility  Past Medical History:  Past Medical History:  Diagnosis Date  . Benign prostatic hyperplasia (BPH) with urinary urgency   . Bradycardia   . Diverticulosis   . Food impaction of esophagus   . Hypothyroid   . OA (osteoarthritis) of knee    Past Surgical History:  Past Surgical History:  Procedure Laterality Date  . EP IMPLANTABLE DEVICE    . TOTAL KNEE ARTHROPLASTY Right    Social History:  reports that he has never smoked. He has never used smokeless tobacco. He reports previous alcohol use. He reports previous drug use.  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 Education: 6th grade Read: Yes Write: Yes Employment Status: Retired Public relations account executive Issues: Denies Guardian/Conservator: N/A   Abuse/Neglect Abuse/Neglect Assessment Can Be Completed: Yes Physical Abuse: Denies Verbal Abuse: Denies Sexual Abuse: Denies Exploitation of patient/patient's resources: Denies Self-Neglect: Denies  Emotional Status Pt's affect, behavior and adjustment status: Pt was in good spirits at time of visit Recent Psychosocial Issues: Denies Psychiatric History: Denies Substance Abuse History: Denies  Patient / Family Perceptions, Expectations & Goals Pt/Family understanding of illness & functional limitations: Pt and wife have a general  understanding of care needs Premorbid pt/family roles/activities: Independent Anticipated changes in roles/activities/participation: Supervision with ADLs/IADLs Pt/family expectations/goals: pt goal is to improve pain in his ankles and feet  [employer]: None Premorbid Home Care/DME Agencies: None Transportation available at discharge: wife  Discharge Planning Living Arrangements: Spouse/significant other Support Systems: Spouse/significant other,Children Type of Residence: Private residence Insurance underwriter Resources: [employer] (specify) ([place]) Financial Resources: Social [place] Referred: No Living Expenses: Own Money Management: Spouse,Patient Does the patient have any problems obtaining your medications?: No Home Management: Wife managed all homecare needs Patient/Family Preliminary Plans: No changes Care Coordinator Barriers to Discharge: Decreased caregiver support,Lack of/limited family support Care Coordinator Anticipated Follow Up Needs: HH/OP Expected length of stay: 5-7 days  Clinical Impression SW met with pt in room to introduce self, explain role, and discuss discharge process. Pt reports he will have PRN support from both of his sons that live within 5 minutes of the home. His son Darrin lives on the same land. Pt is not a English as a second language teacher. No HCPOA. DME- [employer] [facility] and RW- but states they are both old. Believes he got than atleast 54 years ago. SW explained Medicare policy on DME if under 5 years they will not cover cost. Pt aware SW to follow-up with his wife.   SW spoke with [name] [phone]) to introduce self, explain role, and discuss discharge process. SW informed on pt ELOS 5-7 days. SW to follow-up after team conference to confirm. SW discussed recommendations of HHPT/OT and DME: RW and [employer]in[employer] BSC. SW will make efforts to order. When discussing [facility] preference, prefers  [facility]. SW to explore option.   SW submitted DME order with [place] via parachute. SW will send HHPT/OT referral to [facility] (p:[phone]/f:[phone]).  Adelie Croswell A Shamela Haydon 05/01/2021, 12:14 PM

## 2021-05-01 NOTE — Progress Notes (Signed)
Occupational Therapy Session Note  Patient Details  Name: Cesar Ayers MRN: [id number] Date of Birth: 14-Jun-1946  Today's Date: 05/01/2021 OT Individual Time: 7129-2909 OT Individual Time Calculation (min): 41 min    Short Term Goals: Week 1:  OT Short Term Goal 1 (Week 1): STGs = LTGs d/t ELOS  Skilled Therapeutic Interventions/Progress Updates:    Treatment session with focus on standing balance, endurance, and generalized strengthening.  Pt received semi-reclined in bed reporting need to toilet.  Pt ambulated to bathroom with RW with supervision.  Pt completed toileting with distant supervision at sit > stand level.  Pt ambulated to Dayroom with supervision.  Engaged in corn hole activity in standing without UE support to challenge standing balance and endurance.  Pt able to walk ~20-25 feet without AD with CGA to retreive tossed bean bags.  Therapist providing CGA when pt bending to retrieve bean bags from floor.  Pt with 1 LOB when moving around corn hole board and required min assist to correct.  Engaged in ball toss in standing, incorporating mini squats for BLE strengthening and endurance.  Pt required rest breaks throughout session due to decreased endurance.  Pt ambulated back to room with RW and left seated EOB with bed alarm on and all needs in reach.  Therapy Documentation Precautions:  Precautions Precautions: Fall Restrictions Weight Bearing Restrictions: No Pain:  Pt with c/o pain at PEG tube site.  Repositioned.   Therapy/Group: Individual Therapy  [name] 05/01/2021, 12:12 PM

## 2021-05-01 NOTE — Progress Notes (Signed)
Occupational Therapy Session Note  Patient Details  Name: Cesar Ayers MRN: [id number] Date of Birth: 29-Oct-1946  Today's Date: 05/01/2021 OT Individual Time: 0700-0800 OT Individual Time Calculation (min): 60 min    Short Term Goals: Week 1:  OT Short Term Goal 1 (Week 1): STGs = LTGs d/t ELOS  Skilled Therapeutic Interventions/Progress Updates:    Pt resting in bed upon arrival. Pt completed bathing/dressing with sit<>stand from w/c at sink-supervision. Pt requrested to use bathroom and amb with RW to bathroom with supervision. Toileting with supervision. Pt amb with RW to day room with supervisoin. Pt requested to rest in day room. Pt amb with RW back to room and requested to return to bed. Pt remained in bed with all needs within reach and bed alarm activated.   Therapy Documentation Precautions:  Precautions Precautions: Fall Restrictions Weight Bearing Restrictions: No   Pain:  Pt c/o discomfort around PEG site; activity    Therapy/Group: Individual Therapy  Leroy Libman 05/01/2021, 9:32 AM

## 2021-05-01 NOTE — Progress Notes (Signed)
Occupational Therapy Session Note  Patient Details  Name: Cesar Ayers MRN: [id number] Date of Birth: 1946/02/16  Today's Date: 05/01/2021 OT Individual Time: 1500-1530 OT Individual Time Calculation (min): 30 min    Short Term Goals: Week 1:  OT Short Term Goal 1 (Week 1): STGs = LTGs d/t ELOS  Skilled Therapeutic Interventions/Progress Updates:    Pt received in room and consented to OT tx. Pt completed SPT from EOB to w/c with close SUP. Pt seen for BUE HEP to increase strength and activity tolerance for ADLs and functional transfers. Pt instructed in 3# dowel rod exercises including chest press, shoulder press, elbow flexion, and shoulder flexion for 3x15 with min cuing for proper technique with good carryover. After tx, pt helped back to bed with SUP and left with all needs met.   Therapy Documentation Precautions:  Precautions Precautions: Fall Restrictions Weight Bearing Restrictions: No Vital Signs: Therapy Vitals Pulse Rate: 89 Resp: 16 BP: 118/76 Patient Position (if appropriate): Lying Oxygen Therapy SpO2: 97 % O2 Device: Room Air Pain: none     Therapy/Group: Individual Therapy  Vicenta Olds 05/01/2021, 3:12 PM

## 2021-05-01 NOTE — Progress Notes (Signed)
PROGRESS NOTE   Subjective/Complaints:  Pt reports had received a New Feding tube/PEG 3-4 days before came to CIR- but was originally placed in march- ~26th, the day he went into the hospital.   Was asking if could shower- I see no reason why not and informed OT and pt.   ROS:  Pt denies SOB, abd pain, CP, N/V/C/D, and vision changes   Objective:   No results found. Recent Labs    04/30/21 0510  WBC 206.1*  HGB 9.8*  HCT 31.7*  PLT 253   Recent Labs    04/30/21 0510 05/01/21 0629  NA 133* 135  K 5.3* 5.4*  CL 101 104  CO2 25 23  GLUCOSE 147* 137*  BUN 34* 30*  CREATININE 1.93* 1.94*  CALCIUM 8.6* 8.7*    Intake/Output Summary (Last 24 hours) at 05/01/2021 1401 Last data filed at 05/01/2021 0700 Gross per 24 hour  Intake 480 ml  Output --  Net 480 ml        Physical Exam: Vital Signs Blood pressure 108/61, pulse 79, temperature 98 F (36.7 C), resp. rate 16, height 5\' 7"  (1.702 m), weight 87.4 kg, SpO2 96 %.   Physical Exam   General: awake, alert, appropriate, sitting up- working with OT;  NAD HENT: conjugate gaze; oropharynx moist CV: regular rate; no JVD Pulmonary: CTA B/L; no W/R/R- good air movement GI: soft, NT, ND, (+)BS; PEG in place- not one I've removed before Psychiatric: appropriate; interactive Neurological: Ox3 Musculoskeletal:     Cervical back: Normal range of motion and neck supple.     Comments: No edema or tenderness in extremities  Skin:    General: Skin is warm and dry.     Comments: Left forehead with nickel size ulcerated lesion. Large dry scab nasal septum.   Neurological:     Mental Status: He is alert.     Comments: Alert HOH Motor: 4+/5 throughout      Assessment/Plan: 1. Functional deficits which require 3+ hours per day of interdisciplinary therapy in a comprehensive inpatient rehab setting.  Physiatrist is providing close team supervision and 24 hour  management of active medical problems listed below.  Physiatrist and rehab team continue to assess barriers to discharge/monitor patient progress toward functional and medical goals  Care Tool:  Bathing    Body parts bathed by patient: Right arm,Left arm,Chest,Abdomen,Front perineal area,Buttocks,Right upper leg,Left upper leg,Face,Left lower leg,Right lower leg   Body parts bathed by helper: Right lower leg     Bathing assist Assist Level: Supervision/Verbal cueing     Upper Body Dressing/Undressing Upper body dressing   What is the patient wearing?: Button up shirt    Upper body assist Assist Level: Supervision/Verbal cueing    Lower Body Dressing/Undressing Lower body dressing      What is the patient wearing?: Pants,Underwear/pull up     Lower body assist Assist for lower body dressing: Supervision/Verbal cueing     Toileting Toileting    Toileting assist Assist for toileting: Supervision/Verbal cueing     Transfers Chair/bed transfer  Transfers assist     Chair/bed transfer assist level: Supervision/Verbal cueing     Locomotion Ambulation  Ambulation assist      Assist level: Minimal Assistance - Patient > 75% Assistive device: Hand held assist Max distance: 80'   Walk 10 feet activity   Assist     Assist level: Minimal Assistance - Patient > 75% Assistive device: Hand held assist   Walk 50 feet activity   Assist    Assist level: Minimal Assistance - Patient > 75% Assistive device: Hand held assist    Walk 150 feet activity   Assist Walk 150 feet activity did not occur: Safety/medical concerns         Walk 10 feet on uneven surface  activity   Assist Walk 10 feet on uneven surfaces activity did not occur: Safety/medical concerns         Wheelchair     Assist Will patient use wheelchair at discharge?: No             Wheelchair 50 feet with 2 turns activity    Assist            Wheelchair 150  feet activity     Assist          Blood pressure 108/61, pulse 79, temperature 98 F (36.7 C), resp. rate 16, height 5\' 7"  (1.702 m), weight 87.4 kg, SpO2 96 %.  Medical Problem List and Plan: 1.  Deficits with mobility, endurance secondary to debility             -patient may not shower             -ELOS/Goals: 5-7 days/mod I             Admit to CIR  -con't PT and OT- per SLP- swallowing issues are esophageal and cannot help  -con't PT and OT- ELOS 5-7 days- and pt is on track 2.  Antithrombotics: -DVT/anticoagulation:  Pharmaceutical: Lovenox             -antiplatelet therapy: N/a 3. Pain Management:              Oxycodone as needed  6/2- pt not taking- suggested taking pain meds to help him participate in therapy.  4. Mood: LCSW to follow for evaluation and support.              -antipsychotic agents: N/a 5. Neuropsych: This patient is capable of making decisions on his own behalf. 6. Skin/Wound Care:              Routine Feeding tube care.              Continue water flushes per tube.  7. Fluids/Electrolytes/Nutrition: Monitor I/O. Check lytes in am.  8.  Hyperkalemia: Question due to tube feeds/CKD.              6/2- K+ 5.3- will give a dose of Lokelma and recheck in AM  6/3- K+ still 5.4- actually slightly more - will give 10G of Lokelma and if still up tomorrow, might need renal involved vs scheduled Lokelma.  9.  CLL:             WBCs 187.8 on 5/30             Follow-up as outpatient [facility] attempting to set F/u)  6/3- have ordered for Saturday- if keeps going up, call Heme/Onc to discuss when we should get concerned. WBC 206 yesterday             Continue to monitor 10.  Acute blood loss anemia  Hemoglobin 9.9 on 5/30             CBC ordered for tomorrow 11. H/o esophageal stricture with PEG:             Tolerating Dysphagia 3, thins with good intake.              Add intermittent supervision at meals  6/2- SLp doesn't feel there's a reason to  fallow- since esophageal issues  6/3 will look to remove PEG by IR next week- it's not one I've removed before.  12. T2DM- diet controlled.             HbA1c ordered for tomorrow AM  6/2- BGs are mid 100s; A1c wasn't done- will reorder for AM  6/2- HbA1c 7.2- not as bad as thought it might be- con't regimen             Continue to monitor BS ac/hs 12. CKD: Baseline SCr 1.3 per records review             SCr improving from 2.0-->1.83.   6/3- Cr stable at 1.94- push more fluids             CMP ordered for tomorrow 13. Hypothyroid: Was on 75 mcg/day PTA.              -- Synthroid increased to 88 mcg on 05/26.      LOS: 2 days A FACE TO FACE EVALUATION WAS PERFORMED  Savina Olshefski 05/01/2021, 2:01 PM

## 2021-05-01 NOTE — Progress Notes (Signed)
Calorie Count Note  Diet: Dysphagia 3 diet with thin liquids Supplements:   Ensure Enlive po BID, each supplement provides 350 kcal and 20 grams of protein  30 ml Prosource plus po BID, each supplement provides 100 kcal and 15 grams of protein.   Breakfast: 424 kcal, 15 grams of protein Lunch: 645 kcal, 14 grams of protein Dinner: 198 kcal, 8 grams of protein Supplements: 900 kcal, 70 grams of protein  Total intake:  2167 kcal (100% of kcal needs)  107 grams of protein (100% of protein needs)  Estimated Nutritional Needs:  Kcal:  1900-2100 Protein:  90-105 grams Fluid:  >/= 1.9 L/day  G-tube remains in place. Meal completion has been varied from 25-100%. Pt reports having a good appetite, however reports abdominal soreness from incision site. Pt currently has Ensure and Prosource Plus ordered and has been consuming them. RD to continue with current orders to aid in caloric and protein needs. Pt is meeting 100% of nutrition needs by mouth. RD to discontinue calorie count. Intake has been adequate.   Nutrition Dx:  Increased nutrient needs related to  (therapy) as evidenced by estimated needs; ongoing  Goal:  Pt to meet >/= 90% of their estimated nutrition needs; met  Intervention:   Continue Ensure Enlive po BID, each supplement provides 350 kcal and 20 grams of protein  Continue 30 ml Prosource plus po BID, each supplement provides 100 kcal and 15 grams of protein.   Calorie count discontinued.  [name], MS, RD, LDN RD pager number/after hours weekend pager number on Amion.

## 2021-05-01 NOTE — Care Management (Signed)
Inpatient Rehabilitation Center Individual Statement of Services  Patient Name:  Cesar Ayers  Date:  05/01/2021  Welcome to the [facility].  Our goal is to provide you with an individualized program based on your diagnosis and situation, designed to meet your specific needs.  With this comprehensive rehabilitation program, you will be expected to participate in at least 3 hours of rehabilitation therapies Monday-Friday, with modified therapy programming on the weekends.  Your rehabilitation program will include the following services:  Physical Therapy (PT), Occupational Therapy (OT), Speech Therapy (ST), 24 hour per day rehabilitation nursing, Therapeutic Recreaction (TR), Psychology, Neuropsychology, Care Coordinator, Rehabilitation Medicine, Nutrition Services, Pharmacy Services and Other  Weekly team conferences will be held on Tuesdays to discuss your progress.  Your Inpatient Rehabilitation Care Coordinator will talk with you frequently to get your input and to update you on team discussions.  Team conferences with you and your family in attendance may also be held.  Expected length of stay: 5-7 days  Overall anticipated outcome: Supervision  Depending on your progress and recovery, your program may change. Your Inpatient Rehabilitation Care Coordinator will coordinate services and will keep you informed of any changes. Your Inpatient Rehabilitation Care Coordinator's name and contact numbers are listed  below.  The following services may also be recommended but are not provided by the [facility] will be made to provide these services after discharge if needed.  Arrangements include referral to agencies that provide these services.  Your insurance has been verified to be:  Medicare A/B  Your primary  doctor is:  Hart Carwin [facility]  Pertinent information will be shared with your doctor and your insurance company.  Inpatient Rehabilitation Care Coordinator:  Cathleen Corti [phone] or [phone]  Information discussed with and copy given to patient by: Rana Snare, 05/01/2021, 11:50 AM

## 2021-05-02 LAB — BASIC METABOLIC PANEL
Anion gap: 10 (ref 5–15)
BUN: 27 mg/dL — ABNORMAL HIGH (ref 8–23)
CO2: 25 mmol/L (ref 22–32)
Calcium: 8.9 mg/dL (ref 8.9–10.3)
Chloride: 101 mmol/L (ref 98–111)
Creatinine, Ser: 1.99 mg/dL — ABNORMAL HIGH (ref 0.61–1.24)
GFR, Estimated: 35 mL/min — ABNORMAL LOW (ref >60)
Glucose, Bld: 137 mg/dL — ABNORMAL HIGH (ref 70–99)
Potassium: 4.3 mmol/L (ref 3.5–5.1)
Sodium: 136 mmol/L (ref 135–145)

## 2021-05-02 LAB — GLUCOSE, CAPILLARY
Glucose-Capillary: 141 mg/dL — ABNORMAL HIGH (ref 70–99)
Glucose-Capillary: 151 mg/dL — ABNORMAL HIGH (ref 70–99)
Glucose-Capillary: 125 mg/dL — ABNORMAL HIGH (ref 70–99)
Glucose-Capillary: 141 mg/dL — ABNORMAL HIGH (ref 70–99)

## 2021-05-02 LAB — CBC WITH DIFFERENTIAL/PLATELET
Abs Immature Granulocytes: 0.44 10*3/uL — ABNORMAL HIGH (ref 0.00–0.07)
Basophils Absolute: 0.5 10*3/uL — ABNORMAL HIGH (ref 0.0–0.1)
Basophils Relative: 0 %
Eosinophils Absolute: 0.5 10*3/uL (ref 0.0–0.5)
Eosinophils Relative: 0 %
HCT: 32.5 % — ABNORMAL LOW (ref 39.0–52.0)
Hemoglobin: 10 g/dL — ABNORMAL LOW (ref 13.0–17.0)
Immature Granulocytes: 0 %
Lymphocytes Relative: 90 %
Lymphs Abs: 156.9 10*3/uL — ABNORMAL HIGH (ref 0.7–4.0)
MCH: 30.3 pg (ref 26.0–34.0)
MCHC: 30.8 g/dL (ref 30.0–36.0)
MCV: 98.5 fL (ref 80.0–100.0)
Monocytes Absolute: 13 10*3/uL — ABNORMAL HIGH (ref 0.1–1.0)
Monocytes Relative: 7 %
Neutro Abs: 5.5 10*3/uL (ref 1.7–7.7)
Neutrophils Relative %: 3 %
Platelets: 214 10*3/uL (ref 150–400)
RBC: 3.3 MIL/uL — ABNORMAL LOW (ref 4.22–5.81)
RDW: 14.7 % (ref 11.5–15.5)
WBC: 176.8 10*3/uL (ref 4.0–10.5)
nRBC: 0 % (ref 0.0–0.2)

## 2021-05-02 NOTE — Progress Notes (Signed)
PROGRESS NOTE   Subjective/Complaints: Somnolent Critically elevated WBC, trending downward K+ normalized Cr worsening  ROS:  Pt denies SOB, abd pain, CP, N/V/C/D, and vision changes   Objective:   No results found. Recent Labs    04/30/21 0510 05/02/21 0520  WBC 206.1* 176.8*  HGB 9.8* 10.0*  HCT 31.7* 32.5*  PLT 253 214   Recent Labs    05/01/21 0629 05/02/21 0520  NA 135 136  K 5.4* 4.3  CL 104 101  CO2 23 25  GLUCOSE 137* 137*  BUN 30* 27*  CREATININE 1.94* 1.99*  CALCIUM 8.7* 8.9    Intake/Output Summary (Last 24 hours) at 05/02/2021 1420 Last data filed at 05/02/2021 1228 Gross per 24 hour  Intake 400 ml  Output 450 ml  Net -50 ml        Physical Exam: Vital Signs Blood pressure 126/82, pulse 75, temperature 98.4 F (36.9 C), resp. rate 17, height 5\' 7"  (1.702 m), weight 87.4 kg, SpO2 98 %.   Physical Exam Gen: no distress, normal appearing HEENT: oral mucosa pink and moist, NCAT Cardio: Reg rate Chest: normal effort, normal rate of breathing GI: soft, NT, ND, (+)BS; PEG in place- not one I've removed before Psychiatric: appropriate; interactive Neurological: Ox3 Musculoskeletal:     Cervical back: Normal range of motion and neck supple.     Comments: No edema or tenderness in extremities  Skin:    General: Skin is warm and dry.     Comments: Left forehead with nickel size ulcerated lesion. Large dry scab nasal septum.   Neurological:     Mental Status: He is alert.     Comments: Alert HOH Motor: 4+/5 throughout      Assessment/Plan: 1. Functional deficits which require 3+ hours per day of interdisciplinary therapy in a comprehensive inpatient rehab setting.  Physiatrist is providing close team supervision and 24 hour management of active medical problems listed below.  Physiatrist and rehab team continue to assess barriers to discharge/monitor patient progress toward  functional and medical goals  Care Tool:  Bathing    Body parts bathed by patient: Right arm,Left arm,Chest,Abdomen,Front perineal area,Buttocks,Right upper leg,Left upper leg,Face,Left lower leg,Right lower leg   Body parts bathed by helper: Right lower leg     Bathing assist Assist Level: Supervision/Verbal cueing     Upper Body Dressing/Undressing Upper body dressing   What is the patient wearing?: Button up shirt    Upper body assist Assist Level: Supervision/Verbal cueing    Lower Body Dressing/Undressing Lower body dressing      What is the patient wearing?: Underwear/pull up,Pants     Lower body assist Assist for lower body dressing: Supervision/Verbal cueing     Toileting Toileting    Toileting assist Assist for toileting: Supervision/Verbal cueing     Transfers Chair/bed transfer  Transfers assist     Chair/bed transfer assist level: Supervision/Verbal cueing     Locomotion Ambulation   Ambulation assist      Assist level: Supervision/Verbal cueing Assistive device: Walker-rolling Max distance: 154ft   Walk 10 feet activity   Assist     Assist level: Supervision/Verbal cueing Assistive device: Walker-rolling  Walk 50 feet activity   Assist    Assist level: Supervision/Verbal cueing Assistive device: Walker-rolling    Walk 150 feet activity   Assist Walk 150 feet activity did not occur: Safety/medical concerns         Walk 10 feet on uneven surface  activity   Assist Walk 10 feet on uneven surfaces activity did not occur: Safety/medical concerns         Wheelchair     Assist Will patient use wheelchair at discharge?: No             Wheelchair 50 feet with 2 turns activity    Assist            Wheelchair 150 feet activity     Assist          Blood pressure 126/82, pulse 75, temperature 98.4 F (36.9 C), resp. rate 17, height 5\' 7"  (1.702 m), weight 87.4 kg, SpO2 98 %.  Medical  Problem List and Plan: 1.  Deficits with mobility, endurance secondary to debility             -patient may not shower             -ELOS/Goals: 5-7 days/mod I  -con't PT and OT- per SLP- swallowing issues are esophageal and cannot help  -Continue PT and OT- ELOS 5-7 days- and pt is on track 2.  Antithrombotics: -DVT/anticoagulation:  Pharmaceutical: Lovenox             -antiplatelet therapy: N/a 3. Pain Management:              Oxycodone as needed  6/2- pt not taking- suggested taking pain meds to help him participate in therapy.  4. Mood: LCSW to follow for evaluation and support.              -antipsychotic agents: N/a 5. Neuropsych: This patient is capable of making decisions on his own behalf. 6. Skin/Wound Care:              Routine Feeding tube care.              Continue water flushes per tube.  7. Fluids/Electrolytes/Nutrition: Monitor I/O. Recheck lytes Monday 8.  Hyperkalemia: Question due to tube feeds/CKD.              6/2- K+ 5.3- will give a dose of Lokelma and recheck in AM  6/3- K+ still 5.4- actually slightly more - will give 10G of Lokelma and if still up tomorrow, might need renal involved vs scheduled Lokelma.   6/4: K+ reviewed and normalized, repeat Monday 9.  CLL:             WBCs 187.8 on 5/30             Follow-up as outpatient [facility] attempting to set F/u)  6/3- have ordered for Saturday- if keeps going up, call Heme/Onc to discuss when we should get concerned. WBC 206 yesterday  6/4: WBC reviewed and is trending downward, repeat Monday             Continue to monitor 10.  Acute blood loss anemia Hgb 10 on 6/4, repeat monday 11. H/o esophageal stricture with PEG:             Tolerating Dysphagia 3, thins with good intake.              Add intermittent supervision at meals  6/2- SLp doesn't feel there's a reason to fallow-  since esophageal issues  6/3 will look to remove PEG by IR next week- it's not one I've removed before.  12. T2DM- diet controlled.              HbA1c ordered for tomorrow AM  6/2- BGs are mid 100s; A1c wasn't done- will reorder for AM  6/2- HbA1c 7.2- not as bad as thought it might be- con't regimen             Continue to monitor BS ac/hs 12. CKD: Baseline SCr 1.3 per records review             SCr improving from 2.0-->1.83.   6/3- Cr stable at 1.94- push more fluids             CMP ordered for tomorrow 13. Hypothyroid: Was on 75 mcg/day PTA.              -- Synthroid increased to 88 mcg on 05/26.      LOS: 3 days A FACE TO FACE EVALUATION WAS PERFORMED  Martha Clan P Marcile Fuquay 05/02/2021, 2:20 PM

## 2021-05-03 LAB — GLUCOSE, CAPILLARY
Glucose-Capillary: 142 mg/dL — ABNORMAL HIGH (ref 70–99)
Glucose-Capillary: 141 mg/dL — ABNORMAL HIGH (ref 70–99)
Glucose-Capillary: 152 mg/dL — ABNORMAL HIGH (ref 70–99)
Glucose-Capillary: 136 mg/dL — ABNORMAL HIGH (ref 70–99)

## 2021-05-03 MED ORDER — HYDROCERIN EX CREA
TOPICAL_CREAM | Freq: Two times a day (BID) | CUTANEOUS | Status: DC
  Filled 2021-05-03: qty 113

## 2021-05-03 NOTE — Progress Notes (Signed)
Occupational Therapy Session Note  Patient Details  Name: Cesar Ayers MRN: [id number] Date of Birth: 04/09/1946  Today's Date: 05/03/2021 OT Individual Time: 1330-1355 OT Individual Time Calculation (min): 25 min    Short Term Goals: Week 1:  OT Short Term Goal 1 (Week 1): STGs = LTGs d/t ELOS  Skilled Therapeutic Interventions/Progress Updates:    OT intervention with focus on functional amb with RW, toilet transfers/toileting, and BLE therex to increase independence with BADLs. Amb with RW in room and hallway with [place]. Functional tranfsers and bed moblity with mod I. Toileting with supervisino. BLE therex on NuStep 2x5 mins level 5. Pt returned to bed and remained in bed with bed alarm activated. All needs within reach.   Therapy Documentation Precautions:  Precautions Precautions: Fall Restrictions Weight Bearing Restrictions: No Pain:  "I think my gout is trying to act up" when referring to L foot; rest and repostioned   Therapy/Group: Individual Therapy  Leroy Libman 05/03/2021, 1:57 PM

## 2021-05-03 NOTE — Progress Notes (Signed)
Occupational Therapy Session Note  Patient Details  Name: Petr Bontempo MRN: [id number] Date of Birth: 04/22/1946  Today's Date: 05/03/2021 OT Individual Time: 9977-4142 OT Individual Time Calculation (min): 54 min    Short Term Goals: Week 1:  OT Short Term Goal 1 (Week 1): STGs = LTGs d/t ELOS  Skilled Therapeutic Interventions/Progress Updates:    OT intervention with focus on bed mobility, functional amb with RW, bathing at shower level, dressing with sit<>stand from seat, activity tolerance, discharge planning, and safety awareness to increase independence with BADLs. Functional amb in room with RW to enter/exit bathroom. Toilet transfers with supervision. All amb with RW at supervision. Pt amb in bathroom with HHA and no AD. Bathing with supervsion using lateral leans to clean buttocks. Dressing with sit<>stand to pull pants over hips. Pt requires more then a reasonable amount of time to complete tasks. Pt required multiple extended rest breaks and commented that he tires out "pretty easy." Pt returned to bed and remained in bed with all needs within reach and bed alarm activated.   Therapy Documentation Precautions:  Precautions Precautions: Fall Restrictions Weight Bearing Restrictions: No  Pain:  Pt reports "slight" discomfort around PEG site; repositioned  Therapy/Group: Individual Therapy  Leroy Libman 05/03/2021, 8:58 AM

## 2021-05-03 NOTE — Progress Notes (Signed)
Physical Therapy Session Note  Patient Details  Name: Cesar Ayers MRN: [id number] Date of Birth: 26-Dec-1945  Today's Date: 05/03/2021 PT Individual Time: 1000-1100; 1515-1410 PT Individual Time Calculation (min): 60 min and 55 min  Short Term Goals: Week 1:  PT Short Term Goal 1 (Week 1): =LTG due to ELOS  Skilled Therapeutic Interventions/Progress Updates:    Session 1: Pt received sidelying in bed, reports some itching on his back. Assisted pt with applying lotion to his back. No other complaints of pain at rest, does have onset of L foot pain with attempted mobility with no AD. Bed mobility mod I with use of bedrail. Sit to stand with Supervision and RW throughout session. 6MWT: 534 ft with RW and Supervision, one seated rest break. Discussed pt's current impaired endurance and importance of continuing ambulation and activity upon d/c home. Attempted gait with no AD for balance challenge, pt has onset of L foot pain limiting tolerance for stance with no AD, deferred. Ambulation through agility ladder forwards and laterally with min HHA with focus on increasing step length. Attempted standing alt L/R cone taps for LE coordination and balance training, pt unable to maintain balance to complete task safely. Transitioned to alt L/R 4" step-taps with no AD and min A for balance, 2 x 10 reps to fatigue. Standing balance and endurance performing ball toss against rebounder, x 35 reps, x 40 reps to fatigue. Ambulation x 150 ft with RW and Supervision back to patient's room, cues for upright trunk during gait. Pt returned to bed at mod I level. Pt left seated in bed with needs in reach, bed alarm in place at end of session.  Session 2: Pt received supine in bed, agreeable to PT session. No complaints of pain. Pt's son and wife present during therapy session and able to complete family education. Pt is independent for bed mobility from flat bed. Sit to stand with RW and Supervision throughout session, cues  for safe RW management. Ambulation 4 x 150 ft with RW and Supervision, cues for upright posture. Ascend/descend 12 x 6" stairs with 2 handrails and close Supervision to CGA for balance, step-through gait pattern. Car transfer with Supervision with cues for safety. Standing balance performing ball toss with narrow BOS on solid ground and normal stance on airex with CGA for balance, 4 x 25 reps. Sit to stand from low, pliable surface of couch with RW and Supervision. Per family report pt's bed at home is soft and low to the ground, not as low as couch in rehab apartment. Sit to/from supine on real bed in rehab apartment independently. Toilet transfer with Supervision, pt is independent for clothing management and pericare. Pt returned to bed at end of session, needs in reach, bed alarm in place, family present. Pt's family with good understanding of current assist level of Supervision overall with RW.  Therapy Documentation Precautions:  Precautions Precautions: Fall Restrictions Weight Bearing Restrictions: No    Therapy/Group: Individual Therapy   Excell Seltzer, PT, DPT, CSRS  05/03/2021, 12:20 PM

## 2021-05-03 NOTE — Progress Notes (Signed)
PROGRESS NOTE   Subjective/Complaints: Would like PEG removed tomorrow. Tolerated OT well  Eucerin ordered for dry skin Has no other complaints  ROS:  Pt denies SOB, abd pain, CP, N/V/C/D, and vision changes   Objective:   No results found. Recent Labs    05/02/21 0520  WBC 176.8*  HGB 10.0*  HCT 32.5*  PLT 214   Recent Labs    05/01/21 0629 05/02/21 0520  NA 135 136  K 5.4* 4.3  CL 104 101  CO2 23 25  GLUCOSE 137* 137*  BUN 30* 27*  CREATININE 1.94* 1.99*  CALCIUM 8.7* 8.9    Intake/Output Summary (Last 24 hours) at 05/03/2021 1527 Last data filed at 05/03/2021 1300 Gross per 24 hour  Intake 840 ml  Output 175 ml  Net 665 ml        Physical Exam: Vital Signs Blood pressure 113/61, pulse 83, temperature 98.4 F (36.9 C), resp. rate 18, height 5\' 7"  (1.702 m), weight 87.4 kg, SpO2 97 %.   Physical Exam Gen: no distress, normal appearing HEENT: oral mucosa pink and moist, NCAT Cardio: Reg rate Chest: normal effort, normal rate of breathing GI: soft, NT, ND, (+)BS; PEG in place- not one I've removed before Psychiatric: appropriate; interactive Neurological: Ox3 Musculoskeletal:     Cervical back: Normal range of motion and neck supple.     Comments: No edema or tenderness in extremities  Skin:    General: Skin is warm and dry.     Comments: Left forehead with nickel size ulcerated lesion. Large dry scab nasal septum.   Neurological:     Mental Status: He is alert.     Comments: Alert HOH Motor: 4+/5 throughout      Assessment/Plan: 1. Functional deficits which require 3+ hours per day of interdisciplinary therapy in a comprehensive inpatient rehab setting.  Physiatrist is providing close team supervision and 24 hour management of active medical problems listed below.  Physiatrist and rehab team continue to assess barriers to discharge/monitor patient progress toward functional and  medical goals  Care Tool:  Bathing    Body parts bathed by patient: Right arm,Left arm,Chest,Abdomen,Front perineal area,Buttocks,Right upper leg,Left upper leg,Face,Left lower leg,Right lower leg   Body parts bathed by helper: Right lower leg     Bathing assist Assist Level: Supervision/Verbal cueing     Upper Body Dressing/Undressing Upper body dressing   What is the patient wearing?: Button up shirt    Upper body assist Assist Level: Supervision/Verbal cueing    Lower Body Dressing/Undressing Lower body dressing      What is the patient wearing?: Underwear/pull up,Pants     Lower body assist Assist for lower body dressing: Supervision/Verbal cueing     Toileting Toileting    Toileting assist Assist for toileting: Supervision/Verbal cueing Assistive Device Comment: urinal   Transfers Chair/bed transfer  Transfers assist     Chair/bed transfer assist level: Supervision/Verbal cueing     Locomotion Ambulation   Ambulation assist      Assist level: Supervision/Verbal cueing Assistive device: Walker-rolling Max distance: 150'   Walk 10 feet activity   Assist     Assist level: Supervision/Verbal cueing Assistive  device: Walker-rolling   Walk 50 feet activity   Assist    Assist level: Supervision/Verbal cueing Assistive device: Walker-rolling    Walk 150 feet activity   Assist Walk 150 feet activity did not occur: Safety/medical concerns  Assist level: Supervision/Verbal cueing Assistive device: Walker-rolling    Walk 10 feet on uneven surface  activity   Assist Walk 10 feet on uneven surfaces activity did not occur: Safety/medical concerns         Wheelchair     Assist Will patient use wheelchair at discharge?: No             Wheelchair 50 feet with 2 turns activity    Assist            Wheelchair 150 feet activity     Assist          Blood pressure 113/61, pulse 83, temperature 98.4 F (36.9  C), resp. rate 18, height 5\' 7"  (1.702 m), weight 87.4 kg, SpO2 97 %.  Medical Problem List and Plan: 1.  Deficits with mobility, endurance secondary to debility             -patient may not shower             -ELOS/Goals: 5-7 days/mod I  -con't PT and OT- per SLP- swallowing issues are esophageal and cannot help  -Continue PT and OT- ELOS 5-7 days- and pt is on track 2.  Impaired mobility -DVT/anticoagulation:  Pharmaceutical: Continue Lovenox             -antiplatelet therapy: N/a 3. Pain Management:             Continue Oxycodone as needed  6/2- pt not taking- suggested taking pain meds to help him participate in therapy.  4. Mood: LCSW to follow for evaluation and support.              -antipsychotic agents: N/a 5. Neuropsych: This patient is capable of making decisions on his own behalf. 6. Skin/Wound Care:              Routine Feeding tube care.              Continue water flushes per tube.  7. Fluids/Electrolytes/Nutrition: Monitor I/O. Recheck lytes Monday 8.  Hyperkalemia: Question due to tube feeds/CKD.              6/2- K+ 5.3- will give a dose of Lokelma and recheck in AM  6/3- K+ still 5.4- actually slightly more - will give 10G of Lokelma and if still up tomorrow, might need renal involved vs scheduled Lokelma.   6/4: K+ reviewed and normalized, repeat Monday 9.  CLL:             WBCs 187.8 on 5/30             Follow-up as outpatient [facility] attempting to set F/u)  6/3- have ordered for Saturday- if keeps going up, call Heme/Onc to discuss when we should get concerned. WBC 206 yesterday  6/4: WBC reviewed and is trending downward, repeat Monday             Continue to monitor 10.  Acute blood loss anemia Hgb 10 on 6/4, repeat monday 11. H/o esophageal stricture with PEG:             Tolerating Dysphagia 3, thins with good intake.              Add intermittent supervision at meals  6/2- SLp doesn't feel there's a reason to fallow- since esophageal issues  6/3 will  look to remove PEG by IR next week- it's not one I've removed before.  12. T2DM- diet controlled.             HbA1c ordered for tomorrow AM  6/2- BGs are mid 100s; A1c wasn't done- will reorder for AM  6/2- HbA1c 7.2- not as bad as thought it might be- con't regimen             6/5: continue to monitor BS ac/hs 12. CKD: Baseline SCr 1.3 per records review             SCr improving from 2.0-->1.83.   6/3- Cr stable at 1.94- push more fluids             CMP ordered for tomorrow 13. Hypothyroid: Was on 75 mcg/day PTA.              -- Synthroid increased to 88 mcg on 05/26, continue     LOS: 4 days A FACE TO FACE EVALUATION WAS [place] Erisa Mehlman 05/03/2021, 3:27 PM

## 2021-05-04 LAB — BASIC METABOLIC PANEL
Anion gap: 9 (ref 5–15)
BUN: 18 mg/dL (ref 8–23)
CO2: 25 mmol/L (ref 22–32)
Calcium: 8.6 mg/dL — ABNORMAL LOW (ref 8.9–10.3)
Chloride: 103 mmol/L (ref 98–111)
Creatinine, Ser: 1.94 mg/dL — ABNORMAL HIGH (ref 0.61–1.24)
GFR, Estimated: 36 mL/min — ABNORMAL LOW (ref >60)
Glucose, Bld: 156 mg/dL — ABNORMAL HIGH (ref 70–99)
Potassium: 4.3 mmol/L (ref 3.5–5.1)
Sodium: 137 mmol/L (ref 135–145)

## 2021-05-04 LAB — CBC
HCT: 33.5 % — ABNORMAL LOW (ref 39.0–52.0)
Hemoglobin: 10.2 g/dL — ABNORMAL LOW (ref 13.0–17.0)
MCH: 29.8 pg (ref 26.0–34.0)
MCHC: 30.4 g/dL (ref 30.0–36.0)
MCV: 98 fL (ref 80.0–100.0)
Platelets: 216 10*3/uL (ref 150–400)
RBC: 3.42 MIL/uL — ABNORMAL LOW (ref 4.22–5.81)
RDW: 15.2 % (ref 11.5–15.5)
WBC: 174 10*3/uL (ref 4.0–10.5)
nRBC: 0 % (ref 0.0–0.2)

## 2021-05-04 LAB — GLUCOSE, CAPILLARY
Glucose-Capillary: 138 mg/dL — ABNORMAL HIGH (ref 70–99)
Glucose-Capillary: 128 mg/dL — ABNORMAL HIGH (ref 70–99)
Glucose-Capillary: 140 mg/dL — ABNORMAL HIGH (ref 70–99)
Glucose-Capillary: 131 mg/dL — ABNORMAL HIGH (ref 70–99)

## 2021-05-04 MED ORDER — HYDROCERIN EX CREA: 1.0000 "application " | TOPICAL_CREAM | Freq: Two times a day (BID) | CUTANEOUS | 0 refills | Status: AC

## 2021-05-04 MED ORDER — DIGESTIVE ADVANTAGE PO CAPS: 1.0000 | ORAL_CAPSULE | Freq: Every day | ORAL | Status: AC

## 2021-05-04 NOTE — Progress Notes (Signed)
PROGRESS NOTE   Subjective/Complaints:  Want to remove his PEG< but was placed at [facility]- we are not allowed to remove their PEG.  Will let pt know.  Says has L foot "gout"- it's not bad and better since drank cherry juice.  Cannot take NSAIDS or colchicine since  kidney issues.   ROS:  Pt denies SOB, abd pain, CP, N/V/C/D, and vision changes   Objective:   No results found. Recent Labs    05/02/21 0520 05/04/21 0704  WBC 176.8* 174.0*  HGB 10.0* 10.2*  HCT 32.5* 33.5*  PLT 214 216   Recent Labs    05/02/21 0520 05/04/21 0704  NA 136 137  K 4.3 4.3  CL 101 103  CO2 25 25  GLUCOSE 137* 156*  BUN 27* 18  CREATININE 1.99* 1.94*  CALCIUM 8.9 8.6*    Intake/Output Summary (Last 24 hours) at 05/04/2021 1804 Last data filed at 05/04/2021 0500 Gross per 24 hour  Intake 440 ml  Output 250 ml  Net 190 ml        Physical Exam: Vital Signs Blood pressure 129/70, pulse 67, temperature 98.6 F (37 C), temperature source Oral, resp. rate 17, height 5\' 7"  (1.702 m), weight 87.4 kg, SpO2 100 %.   Physical Exam   General: awake, alert, appropriate, laying in bed; nurse at bedside; NAD HENT: conjugate gaze; oropharynx moist CV: regular rate; no JVD Pulmonary: CTA B/L; no W/R/R- good air movement GI: soft, NT, ND, (+)BS- PEG I've not seen before in place Psychiatric: appropriate; joking some Neurological: Ox3  Musculoskeletal:     Cervical back: Normal range of motion and neck supple.     Comments: No edema or tenderness in extremities  Skin:    General: Skin is warm and dry.     Comments: Left forehead with nickel size ulcerated lesion. Large dry scab nasal septum. Both healing  Neurological:     Mental Status: He is alert.     Comments: Alert HOH Motor: 4+/5 throughout      Assessment/Plan: 1. Functional deficits which require 3+ hours per day of interdisciplinary therapy in a comprehensive  inpatient rehab setting.  Physiatrist is providing close team supervision and 24 hour management of active medical problems listed below.  Physiatrist and rehab team continue to assess barriers to discharge/monitor patient progress toward functional and medical goals  Care Tool:  Bathing    Body parts bathed by patient: Right arm,Left arm,Chest,Abdomen,Front perineal area,Buttocks,Right upper leg,Left upper leg,Face,Left lower leg,Right lower leg   Body parts bathed by helper: Right lower leg     Bathing assist Assist Level: Supervision/Verbal cueing     Upper Body Dressing/Undressing Upper body dressing   What is the patient wearing?: Pull over shirt    Upper body assist Assist Level: Independent    Lower Body Dressing/Undressing Lower body dressing      What is the patient wearing?: Underwear/pull up,Pants     Lower body assist Assist for lower body dressing: Supervision/Verbal cueing     Toileting Toileting    Toileting assist Assist for toileting: Supervision/Verbal cueing Assistive Device Comment: urinal   Transfers Chair/bed transfer  Transfers assist  Chair/bed transfer assist level: Independent with assistive device Chair/bed transfer assistive device: Programmer, multimedia   Ambulation assist      Assist level: Supervision/Verbal cueing Assistive device: Walker-rolling Max distance: 150'   Walk 10 feet activity   Assist     Assist level: Supervision/Verbal cueing Assistive device: Walker-rolling   Walk 50 feet activity   Assist    Assist level: Supervision/Verbal cueing Assistive device: Walker-rolling    Walk 150 feet activity   Assist Walk 150 feet activity did not occur: Safety/medical concerns  Assist level: Supervision/Verbal cueing Assistive device: Walker-rolling    Walk 10 feet on uneven surface  activity   Assist Walk 10 feet on uneven surfaces activity did not occur: Safety/medical  concerns         Wheelchair     Assist Will patient use wheelchair at discharge?: No             Wheelchair 50 feet with 2 turns activity    Assist            Wheelchair 150 feet activity     Assist          Blood pressure 129/70, pulse 67, temperature 98.6 F (37 C), temperature source Oral, resp. rate 17, height 5\' 7"  (1.702 m), weight 87.4 kg, SpO2 100 %.  Medical Problem List and Plan: 1.  Deficits with mobility, endurance secondary to debility             -patient may not shower             -ELOS/Goals: 5-7 days/mod I  -con't PT and OT- per SLP- swallowing issues are esophageal and cannot help  -Continue PT and OT- ELOS 5-7 days- and pt is on track  6/6- con't PT and OT- d/c Wednesday 2.  Impaired mobility -DVT/anticoagulation:  Pharmaceutical: Continue Lovenox             -antiplatelet therapy: N/a 3. Pain Management:             Continue Oxycodone as needed  6/2- pt not taking- suggested taking pain meds to help him participate in therapy.  4. Mood: LCSW to follow for evaluation and support.              -antipsychotic agents: N/a 5. Neuropsych: This patient is capable of making decisions on his own behalf. 6. Skin/Wound Care:              Routine Feeding tube care.              Continue water flushes per tube.  7. Fluids/Electrolytes/Nutrition: Monitor I/O. Recheck lytes Monday 8.  Hyperkalemia: Question due to tube feeds/CKD.              6/2- K+ 5.3- will give a dose of Lokelma and recheck in AM  6/3- K+ still 5.4- actually slightly more - will give 10G of Lokelma and if still up tomorrow, might need renal involved vs scheduled Lokelma.   6/4: K+ reviewed and normalized, repeat Monday  6/6- K+ down to 4.6- con't regimen 9.  CLL:             WBCs 187.8 on 5/30             Follow-up as outpatient [facility] attempting to set F/u)  6/3- have ordered for Saturday- if keeps going up, call Heme/Onc to discuss when we should get concerned. WBC 206  yesterday  6/4: WBC reviewed and is trending  downward, repeat Monday  6/6- WBC 174- better- con't to monitor and f/u with Oncology             Continue to monitor 10.  Acute blood loss anemia Hgb 10 on 6/4, repeat monday 11. H/o esophageal stricture with PEG:             Tolerating Dysphagia 3, thins with good intake.              Add intermittent supervision at meals  6/2- SLp doesn't feel there's a reason to fallow- since esophageal issues  6/3 will look to remove PEG by IR next week- it's not one I've removed before.   6/6- not able to remove- came from [place] will not remove from another hospital- will let pt know.  12. T2DM- diet controlled.             HbA1c ordered for tomorrow AM  6/2- BGs are mid 100s; A1c wasn't done- will reorder for AM  6/2- HbA1c 7.2- not as bad as thought it might be- con't regimen             6/5: continue to monitor BS ac/hs  6/6- BGs controlled- con't regimen 12. CKD: Baseline SCr 1.3 per records review             SCr improving from 2.0-->1.83.   6/3- Cr stable at 1.94- push more fluids  6/6- Cr still 1.94- stable- con't regimen             CMP ordered for tomorrow 13. Hypothyroid: Was on 75 mcg/day PTA.              -- Synthroid increased to 88 mcg on 05/26, continue     LOS: 5 days A FACE TO FACE EVALUATION WAS PERFORMED  Marchelle Rinella 05/04/2021, 6:04 PM

## 2021-05-04 NOTE — Progress Notes (Signed)
Occupational Therapy Session Note  Patient Details  Name: Cesar Ayers MRN: [id number] Date of Birth: June 02, 1946  Today's Date: 05/04/2021 OT Individual Time: 1300-1355 OT Individual Time Calculation (min): 55 min    Short Term Goals: Week 1:  OT Short Term Goal 1 (Week 1): STGs = LTGs d/t ELOS  Skilled Therapeutic Interventions/Progress Updates:    OT intervention with focus on functional amb with RW in community setting. Emphasis on safety awareness. Pt transported (time mgmt) to [facility]. Amb with RW on concrete patio with uneven surface-100', 150', and 200'. Pt also amb in eating area of [facility]-200'. Pt required extended rest breaks. No unsafe behaviors noted. Pt returned to room and amb with RW to bed. Pt remained in bed with all needs within reach and bed alarm activated.   Therapy Documentation Precautions:  Precautions Precautions: Fall Restrictions Weight Bearing Restrictions: No Pain:  Pt c/o BLE (midcalf and distally); rest and reposition   Therapy/Group: Individual Therapy  Leroy Libman 05/04/2021, 1:55 PM

## 2021-05-04 NOTE — Progress Notes (Signed)
Patient ID: Cesar Ayers, male   DOB: Oct 06, 1946, 75 y.o.   MRN: [id number]  SW received updates from [place] that DME items were covered by insurance 2018, and thie information was explained to his wife. These items were cancelled since they have them at home already.   Per medical team, pt will discharge on 6/8.   SW sent HHPT/OT referral to [facility] ([phone]/[phone]). SW waiting on follow-up if able to accept. *SW spoke with [place] with [facility] who confirmed referral received, and start of care will begin on 6/8.  Loralee Pacas, MSW, [place] Office: [phone] Cell: [phone] Fax: [phone]

## 2021-05-04 NOTE — Discharge Summary (Signed)
Physician Discharge Summary  Patient ID: Cesar Ayers MRN: [id number] DOB/AGE: [birth year] 75 y.o.  Admit date: 04/29/2021 Discharge date: 05/06/2021  Discharge Diagnoses:  Principal Problem:   Debility Active Problems:   Acute blood loss anemia   CLL (chronic lymphocytic leukemia) (HCC)   Discharged Condition: stable   Significant Diagnostic Studies: DG ABDOMEN PEG TUBE LOCATION  Result Date: 04/18/2021 CLINICAL DATA:  Gastrostomy tube location. EXAM: ABDOMEN - 1 VIEW COMPARISON:  None. FINDINGS: The bowel gas pattern is normal. Gastrostomy tube tip appears to be in distal stomach. Contrast filling of the stomach is noted. No extravasation is noted. IMPRESSION: Gastrostomy tube tip appears to be in distal stomach. Electronically Signed   By: Marijo Conception M.D.   On: 04/18/2021 21:37   DG CHEST PORT 1 VIEW  Result Date: 04/18/2021 CLINICAL DATA:  Cough. EXAM: PORTABLE CHEST 1 VIEW COMPARISON:  None. FINDINGS: The heart size and mediastinal contours are within normal limits. 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 No pneumothorax or pleural effusion is noted. Right lung is clear. Mild left basilar atelectasis or infiltrate is noted. The visualized skeletal structures are unremarkable. IMPRESSION: Mild left basilar atelectasis or infiltrate. Electronically Signed   By: Marijo Conception M.D.   On: 04/18/2021 21:36    Labs:  Basic Metabolic Panel: Recent Labs  Lab 04/27/21 1009 04/28/21 0323 04/29/21 0453 04/30/21 0510 05/01/21 0629 05/02/21 0520 05/04/21 0704  NA  --   --   --  133* 135 136 137  K 5.3* 5.7* 4.3 5.3* 5.4* 4.3 4.3  CL  --   --   --  101 104 101 103  CO2  --   --   --  _0 GLUCOSE  --   --   --  147* 137* 137* 156*  BUN  --   --   --  34* 30* 27* 18  CREATININE  --   --   --  1.93* 1.94* 1.99* 1.94*  CALCIUM  --   --   --  8.6* 8.7* 8.9 8.6*    CBC: Recent Labs  Lab 04/30/21 0510 05/02/21 0520 05/04/21 0704  WBC 206.1*  176.8* 174.0*  NEUTROABS 5.7 5.5  --   HGB 9.8* 10.0* 10.2*  HCT 31.7* 32.5* 33.5*  MCV 97.8 98.5 98.0  PLT 253 214 216    CBG: Recent Labs  Lab 05/05/21 0623 05/05/21 1122 05/05/21 1641 05/05/21 2120 05/06/21 0601  GLUCAP 134* 148* 136* 141* 130*    Brief HPI:   Cesar Ayers is a 75 y.o. male with history of bradycardia s/p PPM, diverticulosis, BPH was admitted to [facility] from OSH on 03/29/2021 with esophageal rupture from food impaction with esophageal rupture.  He found to have extensive pneumomediastinum, mediastinal/retroperitoneal/inguinal adenopathy and significant leukocytosis. Esophageal perforation repaired with washout as well as laparotomy with emergent EGD for FB removal and reduction of incarcerated incisional hernia.  Gastrostomy tube was also placed on the same day.  Postop course complicated by septic shock with delirium, diarrhea with abdominal distention due to ileus, delirium, full-thickness coumellar wound from NGT as well as dysphagia.    He was started on dysphagia 1 diet with nocturnal tube feeds for nutrition support.  Heme-onc was consulted and felt that patient with CLL.  He was started on allopurinol for TLS prophylaxis with plans to follow on outpatient basis.  He was discharged to [facility] on 05/21 for wound care and debility.  Wound  had healed well and he was advanced to dysphagia 3 diet.  He continued to have issues with intermittent hypokalemia with persistent leukocytosis as well as hyperglycemia.  TSH was elevated at 10.26 and Synthroid was increased to 88 mcg on 05/28.  Patient continued to have limitations with decreased endurance affecting mobility.  CIR was recommended due to functional decline.   Hospital Course: Cesar Ayers was admitted to rehab 04/29/2021 for inpatient therapies to consist of PT, ST and OT at least three hours five days a week. Past admission physiatrist, therapy team and rehab RN have worked together to provide customized collaborative  inpatient rehab.  He is blood pressures were monitored on TID basis and has been stable.  Repeat check of BMET has showed SCr to be stable and hyperkalemia has resolved after couple of doses of lokelma. CBC showed WBC trending down to 174 and H/H is stable. Hemoglobin A1c ordered and was elevated at 7.2.  Blood sugars were monitored on ac/hs CBG checks and has been controlled on modified diet.   PEG tube remains in place and is to follow-up with surgery later this week for input on removal. Speech therapy evaluation showed that he was tolerating dysphagia 3 diet without signs or symptoms of dysphagia and he is to continue restrictions till follow up with GI. Po intake has been good and he is continent of B/B. He has made steady gains and supervision is recommended for safety. He will continue to receive follow up HHPT and HHOT by [facility] after discharge.    Rehab course: During patient's stay in rehab team conference was held to monitor patient's progress, set goals and discuss barriers to discharge. At admission, patient required min assist with mobility and with ADL tasks. He has had improvement in activity tolerance, balance, postural control as well as ability to compensate for deficits. He requires supervision for bathing and shower level and below body dressing.  He is independent for upper body dressing and supervision was recommended for safety at discharge.  He is independent for transfers and requires supervision to walk 20 feet with rolling walker.  Family education was completed 5.   Disposition: Home   Diet: Soft foods.   Special Instructions: 1.  Repeat TSH in 4 weeks to monitor TSH and for further adjustment in Synthroid dose. 2.  Recommend repeat BMET in 5-7 days to monitor renal status and potassium levels.  3.  Schedule heme-onc appointment this week.   Allergies as of 05/06/2021      Reactions   Ciprofloxacin Rash      Medication List    STOP taking these  medications   Admelog 100 UNIT/ML injection Generic drug: insulin lispro   dextrose 50 % solution   Effer-K 20 MEQ Tbef Generic drug: Potassium Bicarb-Citric Acid   feeding supplement (PRO-STAT 64) Liqd   Glucagon Emergency 1 MG Kit   heparin 5000 UNIT/ML injection   MAG-OX 400 PO   MAGNESIUM SULFATE IN D5W IV   oxyCODONE 5 MG immediate release tablet Commonly known as: Oxy IR/ROXICODONE   potassium chloride SA 20 MEQ tablet Commonly known as: KLOR-CON   PRESCRIPTION MEDICATION   PRESCRIPTION MEDICATION   sodium polystyrene 15 GM/60ML suspension Commonly known as: KAYEXALATE     TAKE these medications   acetaminophen 325 MG tablet Commonly known as: TYLENOL Take 325 mg by mouth every 6 (six) hours as needed (for pain). What changed: Another medication with the same name was removed. Continue taking this medication,  and follow the directions you see here.   allopurinol 100 MG tablet Commonly known as: ZYLOPRIM Take 1 tablet (100 mg total) by mouth daily.   Digestive Advantage Caps Take 1 capsule by mouth daily for 14 days.   hydrocerin Crea Apply 1 application topically 2 (two) times daily.   levothyroxine 88 MCG tablet Commonly known as: SYNTHROID Take 1 tablet (88 mcg total) by mouth daily before breakfast. What changed: Another medication with the same name was removed. Continue taking this medication, and follow the directions you see here.   melatonin 3 MG Tabs tablet Take 3 mg by mouth at bedtime.   pantoprazole 40 MG tablet Commonly known as: PROTONIX Take 1 tablet (40 mg total) by mouth daily.       Follow-up Information    Lovorn, Jinny Blossom, MD Follow up.   Specialty: Physical Medicine and Rehabilitation Why: as needed Contact information: [address] [place] [address] [phone]        Madaline Brilliant, MD. Call.   Specialty: Family Medicine Why: for post hospital follow up Contact information: [place] [address] [phone]        Robynn Pane, MD. Call.   Specialty: Thoracic Diseases Why: for post op appointment Contact information: [place] [address] [phone]               Signed: Bary Leriche 05/06/2021, 11:07 PM

## 2021-05-04 NOTE — Progress Notes (Signed)
Physical Therapy Session Note  Patient Details  Name: Cesar Ayers MRN: [id number] Date of Birth: Jan 09, 1946  Today's Date: 05/04/2021 PT Individual Time: 0900-1000 PT Individual Time Calculation (min): 60 min   Short Term Goals: Week 1:  PT Short Term Goal 1 (Week 1): =LTG due to ELOS  Skilled Therapeutic Interventions/Progress Updates:    Pt received supine in bed asleep, arousable and agreeable to PT session. Pt reports chronic shoulder pain this AM, not rated and declines intervention. Bed mobility independent. Sit to stand mod I to RW. Ambulatory transfer into bathroom with RW and Supervision. Toilet transfer mod I with RW. Pt is independent for clothing management and pericare. Ambulation 2 x 150 ft with RW at Supervision level, cues for upright posture. Ascend/descend one 6" curb step with RW and CGA for balance, cues for safety and safe RW management, x 6 reps. Static standing balance on airex with CGA while performing ball toss against rebounder, 3 x 30 reps to fatigue. Standing alt L/R 4" step-taps with no UE support and CGA for balance. Pt reports onset of B knee pain with this activity, deferred due to pain. Safety task with RW and Supervision while retrieving horseshoes from various height surfaces. Pt returned to bed at end of session, independent for bed mobility. Pt left supine in bed with needs in reach, bed alarm in place at end of session.  Therapy Documentation Precautions:  Precautions Precautions: Fall Restrictions Weight Bearing Restrictions: No   Therapy/Group: Individual Therapy   Excell Seltzer, PT, DPT, CSRS  05/04/2021, 12:02 PM

## 2021-05-04 NOTE — Progress Notes (Signed)
Occupational Therapy Session Note  Patient Details  Name: Bhavin Monjaraz MRN: [id number] Date of Birth: 01/29/1946  Today's Date: 05/04/2021 OT Individual Time: 0700-0810 OT Individual Time Calculation (min): 70 min    Short Term Goals: Week 1:  OT Short Term Goal 1 (Week 1): STGs = LTGs d/t ELOS  Skilled Therapeutic Interventions/Progress Updates:    OT intervention with focus on functional amb with RW, standing balance, toileting, bathing at shower level, dressing with sit<>stand from seat, BLE therex, discharge planning, and activity tolerance to increase independence with BADLs and prepare for discharge. Pt continues to fatigue easily and takes rest breaks appropriately. Bathing, dressing, and toileting at supervision level. All transfers and functrional amb with RW at supervision level. Amb limited 2/2 pain as noted below. BLE therex on NuStep (level 5 for 7 mins, BLE only). Pt returned to room and returned to EOB. Pt remained seated EOB, needs within reach, bed alarm activated.   Therapy Documentation Precautions:  Precautions Precautions: Fall Restrictions Weight Bearing Restrictions: No Pain: Pt c/o L foot (great toe pad) pain (pt says it is gout and his wife brought him some cherry juice)   Therapy/Group: Individual Therapy  Leroy Libman 05/04/2021, 8:12 AM

## 2021-05-05 LAB — GLUCOSE, CAPILLARY
Glucose-Capillary: 134 mg/dL — ABNORMAL HIGH (ref 70–99)
Glucose-Capillary: 148 mg/dL — ABNORMAL HIGH (ref 70–99)
Glucose-Capillary: 136 mg/dL — ABNORMAL HIGH (ref 70–99)
Glucose-Capillary: 141 mg/dL — ABNORMAL HIGH (ref 70–99)

## 2021-05-05 NOTE — Progress Notes (Signed)
Physical Therapy Session Note  Patient Details  Name: Cesar Ayers MRN: [id number] Date of Birth: June 25, 1946  Today's Date: 05/05/2021 PT Individual Time: 0930-1025; 1430-1500 PT Individual Time Calculation (min): 55 min and 30 min  Short Term Goals: Week 1:  PT Short Term Goal 1 (Week 1): =LTG due to ELOS  Skilled Therapeutic Interventions/Progress Updates:    Session 1: Pt received seated in bed, agreeable to PT session. No complaints of pain. Bed mobility independent. Sit to stand and transfer with RW at mod I level throughout session. Ambulation up to 200 ft with RW at Supervision level, cues for decreased trunk flexion and upright posture. Car transfer with Supervision and RW. Ambulation up/down ramp and across uneven surface with RW and Supervision. Ascend/descend 12 x 6" stairs with 2 handrails and Supervision. Patient demonstrates increased fall risk as noted by score of  35/56 on Berg Balance Scale.  (<36= high risk for falls, close to 100%; 37-45 significant >80%; 46-51 moderate >50%; 52-55 lower >25%). Improvement in score from 21/56 on 04/30/21. Reviewed score and ongoing functional implications. Pt able to retrieve clothespins from floor with use of reacher and RW at Supervision level, cues for safety. Pt returned to bed at end of session, independent for bed mobility. Pt left supine in bed with needs in reach, bed alarm in place.  Session 2: Pt received seated in bed, agreeable to PT session. No complaints of pain. Bed mobility independent. Sit to stand and transfers at mod I level with RW throughout session. Ambulation 3 x 150 ft with RW at Supervision level outdoors up/down inclines and across uneven surface. Pt returned to bed at end of session, independent for bed mobility. Pt left supine in bed with needs in reach, bed alarm in place.  Therapy Documentation Precautions:  Precautions Precautions: Fall Restrictions Weight Bearing Restrictions: No   Therapy/Group: Individual  Therapy   Excell Seltzer, PT, DPT, CSRS  05/05/2021, 10:28 AM

## 2021-05-05 NOTE — Progress Notes (Signed)
Occupational Therapy Discharge Summary  Patient Details  Name: Janice Seales MRN: [id number] Date of Birth: 1946-07-24  Patient has met 8 of 8 long term goals due to improved activity tolerance, improved balance and improved awareness. Pt made excellent progress with bathing/dressing, toileting transfers, and toileting during this admission. Pt is supervision for bathing at shower level with sit<>stand from TTB. Independent for UB dressing. Supervision for LB dressing. Mod I for toilet transfers and toileting. Pt's wife has not been present for OT sessions but has been present for PT sessions.  Patient to discharge at overall Supervision level.  Pt reports he feels ready to go and has no concerns with discharge home tomorrow.   Recommendation:  Patient will benefit from ongoing skilled OT services in home health setting to continue to advance functional skills in the area of BADL and iADL.  Equipment: No equipment provided  Reasons for discharge: treatment goals met and discharge from hospital  Patient/family agrees with progress made and goals achieved: Yes  OT Discharge Vision Baseline Vision/History: Wears glasses Wears Glasses: Reading only Patient Visual Report: No change from baseline Vision Assessment?: No apparent visual deficits Perception  Perception: Within Functional Limits Praxis Praxis: Intact Cognition Overall Cognitive Status: Within Functional Limits for tasks assessed Arousal/Alertness: Awake/alert Orientation Level: Oriented X4 Attention: Focused;Sustained Sustained Attention: Appears intact Memory: Appears intact Awareness: Appears intact Problem Solving: Appears intact Safety/Judgment: Appears intact Sensation Sensation [place] Touch: Appears Intact Hot/Cold: Appears Intact Proprioception: Appears Intact Stereognosis: Not tested Motor  Motor Motor: Within Functional Limits Trunk/Postural Assessment  Cervical Assessment Cervical Assessment: Within  Functional Limits Thoracic Assessment Thoracic Assessment:  (rounded shoulders) Lumbar Assessment Lumbar Assessment:  (posterior pelvic tilt)  Balance Static Sitting Balance Static Sitting - Balance Support: Feet supported Static Sitting - Level of Assistance: 6: Modified independent (Device/Increase time) Dynamic Sitting Balance Dynamic Sitting - Balance Support: During functional activity Dynamic Sitting - Level of Assistance: 6: Modified independent (Device/Increase time) Extremity/Trunk Assessment RUE Assessment RUE Assessment: Exceptions to [facility] Active Range of Motion (AROM) Comments: shoulder limitations approx 90* shoulder flexion/abduction 2/2 arthritic limitations LUE Assessment LUE Assessment: Exceptions to [facility] Active Range of Motion (AROM) Comments: shoulder limitations approx 90* shoulder flexion/abduction 2/2 arthritic limitations   Leroy Libman 05/05/2021, 8:17 AM

## 2021-05-05 NOTE — Discharge Instructions (Signed)
Inpatient Rehab Discharge Instructions  Edy Belt Discharge date and time:  05/06/21  Activities/Precautions/ Functional Status: Activity: no lifting, driving, or strenuous exercise till cleared by MD Diet: soft foods Wound Care: Wash with soap and water. Keep clean and dry  Functional status:  ___ No restrictions     ___ Walk up steps independently _X__ 24/7 supervision/assistance   ___ Walk up steps with assistance ___ Intermittent supervision/assistance  ___ Bathe/dress independently ___ Walk with walker     ___ Bathe/dress with assistance ___ Walk Independently    ___ Shower independently ___ Walk with assistance    _X__ Shower with supervision.  _X__ No alcohol     ___ Return to work/school ________    COMMUNITY REFERRALS UPON DISCHARGE:    Home Health:   [facility]  Phone: [phone] *Start of care to begin on 05/06/2021. Please expect a phone call to schedule your home visit. If you have not received follow-up, be sure to contact the branch directly.*    Medical Equipment/Items Ordered: has RW and 3in1 BSC                                                 Agency/Supplier: NA  Special Instructions: 1. Call [facility] back to make appointments with Dr. Steva Ready (Surgeon) and Dr. Andris Baumann (oncologist).    My questions have been answered and I understand these instructions. I will adhere to these goals and the provided educational materials after my discharge from the hospital.  Patient/Caregiver Signature _______________________________ Date __________  Clinician Signature _______________________________________ Date __________  Please bring this form and your medication list with you to all your follow-up doctor's appointments.

## 2021-05-05 NOTE — Progress Notes (Signed)
Physical Therapy Discharge Summary  Patient Details  Name: Cesar Ayers MRN: [id number] Date of Birth: February 04, 1946  Today's Date: 05/05/2021  Patient has met 8 of 8 long term goals due to improved activity tolerance, improved balance, improved postural control and ability to compensate for deficits.  Patient to discharge at an ambulatory level Supervision.   Patient's care partner is independent to provide the necessary cognitive assistance at discharge. Pt's wife and son completed family education and are safe to provide Supervision level assist upon d/c home.  Reasons goals not met: Patient has met all rehab goals.  Recommendation:  Patient will benefit from ongoing skilled PT services in outpatient setting to continue to advance safe functional mobility, address ongoing impairments in endurance, strength, balance, safety, independence with functional mobility, and minimize fall risk.  Equipment: No equipment provided. Pt already owns RW.  Reasons for discharge: treatment goals met and discharge from hospital  Patient/family agrees with progress made and goals achieved: Yes  PT Discharge Precautions/Restrictions Precautions Precautions: Fall Restrictions Weight Bearing Restrictions: No Pain Pain Assessment Pain Scale: 0-10 Pain Score: 0-No pain Vision/Perception  Vision - History Baseline Vision: Wears glasses only for reading Perception Perception: Within Functional Limits Praxis Praxis: Intact  Cognition Overall Cognitive Status: Within Functional Limits for tasks assessed Arousal/Alertness: Awake/alert Orientation Level: Oriented X4 Attention: Focused;Sustained Focused Attention: Appears intact Sustained Attention: Appears intact Memory: Appears intact Awareness: Appears intact Problem Solving: Appears intact Safety/Judgment: Appears intact Sensation Sensation [place] Touch: Appears Intact Hot/Cold: Appears Intact Proprioception: Appears Intact Stereognosis: Not  tested Coordination Gross Motor Movements are Fluid and Coordinated: Yes Fine Motor Movements are Fluid and Coordinated: Yes Coordination and Movement Description: improved since eval Motor  Motor Motor: Within Functional Limits Motor - Discharge Observations: [facility]  Mobility Bed Mobility Bed Mobility: Rolling Left;Left Sidelying to Sit;Sit to Supine;Sit to Sidelying Left Rolling Left: Independent Left Sidelying to Sit: Independent Sit to Supine: Independent Sit to Sidelying Left: Independent Transfers Transfers: Sit to Stand;Stand Pivot Transfers;Stand to Sit Sit to Stand: Independent with assistive device Stand to Sit: Independent with assistive device Stand Pivot Transfers: Independent with assistive device Transfer (Assistive device): Rolling walker Locomotion  Gait Ambulation: Yes Gait Assistance: Supervision/Verbal cueing Gait Distance (Feet): 200 Feet Assistive device: Rolling walker Gait Assistance Details: Verbal cues for precautions/safety Gait Gait: Yes Gait Pattern: Impaired Gait Pattern: Trunk flexed Gait velocity: decreased Stairs / Additional Locomotion Stairs: Yes Stairs Assistance: Supervision/Verbal cueing Stair Management Technique: Two rails;Step to pattern;Alternating pattern Number of Stairs: 12 Height of Stairs: 6 Ramp: Supervision/Verbal cueing Curb: Contact Guard/Touching assist Wheelchair Mobility Wheelchair Mobility: No  Trunk/Postural Assessment  Cervical Assessment Cervical Assessment: Within Functional Limits Thoracic Assessment Thoracic Assessment: Exceptions to [facility] (rounded shoulders) Lumbar Assessment Lumbar Assessment: Exceptions to [facility] (posterior pelvic tilt) Postural Control Postural Control: Within Functional Limits  Balance Balance Balance Assessed: Yes Standardized Balance Assessment Standardized Balance Assessment: Berg Balance Test Berg Balance Test Sit to Stand: Able to stand without using hands and stabilize  independently Standing Unsupported: Able to stand 2 minutes with supervision Sitting with Back Unsupported but Feet Supported on Floor or Stool: Able to sit safely and securely 2 minutes Stand to Sit: Sits safely with minimal use of hands Transfers: Able to transfer safely, minor use of hands Standing Unsupported with Eyes Closed: Able to stand 10 seconds with supervision Standing Ubsupported with Feet Together: Able to place feet together independently and stand for 1 minute with supervision From Standing, Reach Forward with Outstretched Arm: Loses balance while  trying/requires external support From Standing Position, Pick up Object from Floor: Unable to try/needs assist to keep balance From Standing Position, Turn to Look Behind Over each Shoulder: Looks behind one side only/other side shows less weight shift Turn 360 Degrees: Able to turn 360 degrees safely but slowly Standing Unsupported, Alternately Place Feet on Step/Stool: Able to complete 4 steps without aid or supervision Standing Unsupported, One Foot in Front: Able to take small step independently and hold 30 seconds Standing on One Leg: Tries to lift leg/unable to hold 3 seconds but remains standing independently Total Score: 35 Static Sitting Balance Static Sitting - Balance Support: Feet supported Static Sitting - Level of Assistance: 6: Modified independent (Device/Increase time) Dynamic Sitting Balance Dynamic Sitting - Balance Support: During functional activity Dynamic Sitting - Level of Assistance: 6: Modified independent (Device/Increase time) Static Standing Balance Static Standing - Balance Support: No upper extremity supported;During functional activity Static Standing - Level of Assistance: 5: Stand by assistance Dynamic Standing Balance Dynamic Standing - Balance Support: No upper extremity supported;During functional activity Dynamic Standing - Level of Assistance: 5: Stand by assistance Extremity Assessment  RUE  Assessment RUE Assessment: Exceptions to [facility] Active Range of Motion (AROM) Comments: shoulder limitations approx 90* shoulder flexion/abduction 2/2 arthritic limitations LUE Assessment LUE Assessment: Exceptions to [facility] Active Range of Motion (AROM) Comments: shoulder limitations approx 90* shoulder flexion/abduction 2/2 arthritic limitations RLE Assessment RLE Assessment: Within Functional Limits Active Range of Motion (AROM) Comments: tight HS General Strength Comments: 5/5 grossly LLE Assessment LLE Assessment: Within Functional Limits Active Range of Motion (AROM) Comments: tight HS General Strength Comments: 5/5 grossly     Excell Seltzer, PT, DPT, CSRS 05/05/2021, 10:14 AM

## 2021-05-05 NOTE — Progress Notes (Signed)
PROGRESS NOTE   Subjective/Complaints:  Explained pt wants PEG out- explained cannot remove it- needs to be [facility] to take it out- not [place]-  ROS:  Pt denies SOB, abd pain, CP, N/V/C/D, and vision changes     Objective:   No results found. Recent Labs    05/04/21 0704  WBC 174.0*  HGB 10.2*  HCT 33.5*  PLT 216   Recent Labs    05/04/21 0704  NA 137  K 4.3  CL 103  CO2 25  GLUCOSE 156*  BUN 18  CREATININE 1.94*  CALCIUM 8.6*    Intake/Output Summary (Last 24 hours) at 05/05/2021 1012 Last data filed at 05/05/2021 0700 Gross per 24 hour  Intake 360 ml  Output 225 ml  Net 135 ml        Physical Exam: Vital Signs Blood pressure 127/75, pulse 75, temperature 98.5 F (36.9 C), resp. rate 16, height 5\' 7"  (1.702 m), weight 87.4 kg, SpO2 96 %.   Physical Exam    General: awake, alert, appropriate, laying in bed; upset about PEG not being removed; NAD HENT: conjugate gaze; oropharynx moist CV: regular rate; no JVD Pulmonary: CTA B/L; no W/R/R- good air movement GI: soft, NT, ND, (+)BS; (+) PEG in place Psychiatric: appropriate; but irritated Neurological: alert Musculoskeletal:     Cervical back: Normal range of motion and neck supple.     Comments: No edema or tenderness in extremities  Skin:    General: Skin is warm and dry.     Comments: Left forehead with nickel size ulcerated lesion. Large dry scab nasal septum. Both healing  Neurological:     Mental Status: He is alert.     Comments: Alert HOH Motor: 4+/5 throughout      Assessment/Plan: 1. Functional deficits which require 3+ hours per day of interdisciplinary therapy in a comprehensive inpatient rehab setting.  Physiatrist is providing close team supervision and 24 hour management of active medical problems listed below.  Physiatrist and rehab team continue to assess barriers to discharge/monitor patient progress toward  functional and medical goals  Care Tool:  Bathing    Body parts bathed by patient: Right arm,Left arm,Chest,Abdomen,Front perineal area,Buttocks,Right upper leg,Left upper leg,Face,Left lower leg,Right lower leg   Body parts bathed by helper: Right lower leg     Bathing assist Assist Level: Supervision/Verbal cueing     Upper Body Dressing/Undressing Upper body dressing   What is the patient wearing?: Pull over shirt    Upper body assist Assist Level: Independent    Lower Body Dressing/Undressing Lower body dressing      What is the patient wearing?: Underwear/pull up,Pants     Lower body assist Assist for lower body dressing: Supervision/Verbal cueing     Toileting Toileting    Toileting assist Assist for toileting: Independent with assistive device Assistive Device Comment: urinal   Transfers Chair/bed transfer  Transfers assist     Chair/bed transfer assist level: Independent with assistive device Chair/bed transfer assistive device: Programmer, multimedia   Ambulation assist      Assist level: Supervision/Verbal cueing Assistive device: Walker-rolling Max distance: 150'   Walk 10 feet activity  Assist     Assist level: Supervision/Verbal cueing Assistive device: Walker-rolling   Walk 50 feet activity   Assist    Assist level: Supervision/Verbal cueing Assistive device: Walker-rolling    Walk 150 feet activity   Assist Walk 150 feet activity did not occur: Safety/medical concerns  Assist level: Supervision/Verbal cueing Assistive device: Walker-rolling    Walk 10 feet on uneven surface  activity   Assist Walk 10 feet on uneven surfaces activity did not occur: Safety/medical concerns         Wheelchair     Assist Will patient use wheelchair at discharge?: No             Wheelchair 50 feet with 2 turns activity    Assist            Wheelchair 150 feet activity     Assist           Blood pressure 127/75, pulse 75, temperature 98.5 F (36.9 C), resp. rate 16, height 5\' 7"  (1.702 m), weight 87.4 kg, SpO2 96 %.  Medical Problem List and Plan: 1.  Deficits with mobility, endurance secondary to debility             -patient may not shower             -ELOS/Goals: 5-7 days/mod I  -con't PT and OT- per SLP- swallowing issues are esophageal and cannot help  -Continue PT and OT- ELOS 5-7 days- and pt is on track  con't PT and OT- family training today- d/c tomorrow 2.  Impaired mobility -DVT/anticoagulation:  Pharmaceutical: Continue Lovenox             -antiplatelet therapy: N/a 3. Pain Management:             Continue Oxycodone as needed  6/2- pt not taking- suggested taking pain meds to help him participate in therapy.   6/7- pt not taking pain meds- if not taking, doesn't need to go home on them 4. Mood: LCSW to follow for evaluation and support.              -antipsychotic agents: N/a 5. Neuropsych: This patient is capable of making decisions on his own behalf. 6. Skin/Wound Care:              Routine Feeding tube care.              Continue water flushes per tube.  7. Fluids/Electrolytes/Nutrition: Monitor I/O. Recheck lytes Monday 8.  Hyperkalemia: Question due to tube feeds/CKD.              6/2- K+ 5.3- will give a dose of Lokelma and recheck in AM  6/3- K+ still 5.4- actually slightly more - will give 10G of Lokelma and if still up tomorrow, might need renal involved vs scheduled Lokelma.   6/4: K+ reviewed and normalized, repeat Monday  6/6- K+ down to 4.6- con't regimen 9.  CLL:             WBCs 187.8 on 5/30             Follow-up as outpatient [facility] attempting to set F/u)  6/3- have ordered for Saturday- if keeps going up, call Heme/Onc to discuss when we should get concerned. WBC 206 yesterday  6/4: WBC reviewed and is trending downward, repeat Monday  6/6- WBC 174- better- con't to monitor and f/u with Oncology             Continue to  monitor 10.   Acute blood loss anemia Hgb 10 on 6/4, repeat monday 11. H/o esophageal stricture with PEG:             Tolerating Dysphagia 3, thins with good intake.              Add intermittent supervision at meals  6/2- SLp doesn't feel there's a reason to fallow- since esophageal issues  6/3 will look to remove PEG by IR next week- it's not one I've removed before.   6/6- not able to remove- came from [facility] will not remove from another hospital- will let pt know.   6/7- let pt know about PEG_ will get him number to call to schedule 12. T2DM- diet controlled.             HbA1c ordered for tomorrow AM  6/2- BGs are mid 100s; A1c wasn't done- will reorder for AM  6/2- HbA1c 7.2- not as bad as thought it might be- con't regimen             6/5: continue to monitor BS ac/hs  6/6- BGs controlled- con't regimen 12. CKD: Baseline SCr 1.3 per records review             SCr improving from 2.0-->1.83.   6/3- Cr stable at 1.94- push more fluids  6/6- Cr still 1.94- stable- con't regimen             CMP ordered for tomorrow 13. Hypothyroid: Was on 75 mcg/day PTA.              -- Synthroid increased to 88 mcg on 05/26, continue     LOS: 6 days A FACE TO FACE EVALUATION WAS PERFORMED  Gissell Barra 05/05/2021, 10:12 AM

## 2021-05-05 NOTE — Progress Notes (Signed)
Occupational Therapy Session Note  Patient Details  Name: Cesar Ayers MRN: [id number] Date of Birth: 08/15/1946  Today's Date: 05/05/2021 OT Individual Time: 8889-1694 OT Individual Time Calculation (min): 30 min    Short Term Goals: Week 1:  OT Short Term Goal 1 (Week 1): STGs = LTGs d/t ELOS  Skilled Therapeutic Interventions/Progress Updates:    Pt resting in bed upon arrival. OT intervention with focus on bed mobility, functional amb with RW, BLE therex, discharge planning, and safety awareness. Mod I for supine>sit EOB. Pt donned shoes and shirt without assistance. Pt amb with RW (supervision) to day room. NuStep 7 mins level 5 BLE only. Pt amb around day room circuit and returned to room. Pt returned to bed and sat EOB. All needs within reach and bed alarm activated. Pt pleased with progress and ready for discharge home tomorrow.   Therapy Documentation Precautions:  Precautions Precautions: Fall Restrictions Weight Bearing Restrictions: No Pain: Pain Assessment Pain Scale: 0-10 Pain Score: 0-No pain   Therapy/Group: Individual Therapy  Leroy Libman 05/05/2021, 10:28 AM

## 2021-05-05 NOTE — Progress Notes (Signed)
Patient ID: Cesar Ayers, male   DOB: 1946-05-29, 75 y.o.   MRN: [id number]  SW made efforts to make contact with [name] [phone]) to inform on HHA, however vm full.   SW met with pt in room to reiterate he will d/c tomorrow, and [facility] able to accept. SW informed pt on challenges with making contact with pt wife. He encouraged SW to call the home number listed. Pt aware d/c time is between 9am-11am. Possibly may be later since they are travelling from [place].  SW called [phone] and left message for pt wife requesting return phone call.   Loralee Pacas, MSW, [place] Office: [phone] Cell: [phone] Fax: [phone]

## 2021-05-05 NOTE — Patient Care Conference (Signed)
Inpatient RehabilitationTeam Conference and Plan of Care Update Date: 05/05/2021   Time: 11:21 AM    Patient Name: Cesar Ayers      Medical Record Number: [id number]  Date of Birth: 1946/08/07 Sex: Male         Room/Bed: 4W19C/4W19C-01 Payor Info: Payor: MEDICARE / Plan: MEDICARE PART A AND B / Product Type: *No Product type* /    Admit Date/Time:  04/29/2021  3:01 PM  Primary Diagnosis:  [facility] Problems: Principal Problem:   Debility    Expected Discharge Date: Expected Discharge Date: 05/06/21  Team Members Present: Physician leading conference: Dr. Courtney Heys Care Coodinator Present: Loralee Pacas, LCSWA;Rhone Ozaki Creig Hines, RN, BSN, CRRN Nurse Present: Dorthula Nettles, RN PT Present: Excell Seltzer, PT OT Present: Roanna Epley, COTA;Jennifer Tamala Julian, OT PPS Coordinator present : Ileana Ladd, PT     Current Status/Progress Goal Weekly Team Focus  Bowel/Bladder   Patient is continent of bowel and bladder  Patient will remain continent of bowel and bladder  Will assess qshift and PRN   Swallow/Nutrition/ Hydration             ADL's   supervision overall  supervision overall      Mobility   discharge planning, activity tolerance, safety awareness  Supervision overall  d/c planning, endurance, balance   Communication             Safety/Cognition/ Behavioral Observations            Pain   Patient reports no pain at this time  Patient will remain pain free  Will assess qshift and PRN   Skin   Patient has a midline incision  Patient's midline incision will heal  Will assess qshift and PRN     Discharge Planning:      Team Discussion: Wants PEG removed but placed by [facility], they will discontinue post discharge and follow up for CLL. WBC's elevated, Cr. 1.94 and has stayed there. Needs to follow up with PCP post discharge ASAP. Continent B/B. Patient on target to meet rehab goals: yes, at goal level and ready for discharge.  *See Care Plan and progress  notes for long and short-term goals.   Revisions to Treatment Plan:  Medically stable for discharge.  Teaching Needs: Family education complete.  Current Barriers to Discharge: Decreased caregiver support, Medical stability, Home enviroment access/layout, New diabetic, Wound care, Lack of/limited family support, Weight, Medication compliance and Behavior  Possible Resolutions to Barriers: Continue current medications, provide emotional support.     Medical Summary Current Status: K+ 4.6- down from 5.4- s/p lokelma; no pain that wants to take meds for;  Barriers to Discharge: Wound care;Weight;New diabetic;Home enviroment access/layout  Barriers to Discharge Comments: to do outpt therapy Possible Resolutions to [employer] Focus: eating fine, but needs PEG out- ? teach to flush PEG? needs to see PCP ASAP after d/c for labs- f/u with [facility] for CLL and to get PEG out. d/c 6/8   Continued Need for Acute Rehabilitation Level of Care: The patient requires daily medical management by a physician with specialized training in physical medicine and rehabilitation for the following reasons: Direction of a multidisciplinary physical rehabilitation program to maximize functional independence : Yes Medical management of patient stability for increased activity during participation in an intensive rehabilitation regime.: Yes Analysis of laboratory values and/or radiology reports with any subsequent need for medication adjustment and/or medical intervention. : Yes   I attest that I was present, lead the team conference, and concur  with the assessment and plan of the team.   Cristi Loron 05/05/2021, 5:19 PM

## 2021-05-05 NOTE — Progress Notes (Signed)
Occupational Therapy Session Note  Patient Details  Name: Cesar Ayers MRN: [id number] Date of Birth: 1946-05-09  Today's Date: 05/05/2021 OT Individual Time: 1345-1430 OT Individual Time Calculation (min): 45 min    Short Term Goals: Week 1:  OT Short Term Goal 1 (Week 1): STGs = LTGs d/t ELOS  Skilled Therapeutic Interventions/Progress Updates:    Pt received in room in bed and consented to OT tx. Pt seen for ADL analysis including bathing, dressing, and functional transfers and mobility. Pt walked with RW into bathroom and completed walk in shower transfer with close SUP. Pt bathed with setup (therapist applied tegaderm over peg tube site to keep dry) and was able to dress himself on [facility] after shower with setup. Pt completed toileting on toilet with mod I and toilet transfers with SUP with cuing to remain inside of RW during transfers and functional mobility. After ADLs, pt instructed in BUE strengthening HEP to increase strength and activity tolerance for ADLs and functional transfers and mobility while seated EOB. Pt instructed in 3# db unilateral exercises including elbow flexion and shoulder press for 3x15 with min cuing for proper technique with good carryover. Pt reports he has no questions or concerns regarding discharge home tomorrow. After tx, pt laid back down in bed and left with bed alarm on and all needs met.   Therapy Documentation Precautions:  Precautions Precautions: Fall Restrictions Weight Bearing Restrictions: No Vital Signs: Therapy Vitals Temp: 97.8 F (36.6 C) Temp Source: Oral Pulse Rate: 74 Resp: 18 BP: 125/62 Patient Position (if appropriate): Lying Oxygen Therapy SpO2: 99 % O2 Device: Room Air Pain: none     Therapy/Group: Individual Therapy  Arlette Schaad 05/05/2021, 2:01 PM

## 2021-05-06 LAB — GLUCOSE, CAPILLARY: Glucose-Capillary: 130 mg/dL — ABNORMAL HIGH (ref 70–99)

## 2021-05-06 MED ORDER — ALLOPURINOL 100 MG PO TABS: 100.0000 mg | ORAL_TABLET | Freq: Every day | ORAL | 0 refills | Status: AC

## 2021-05-06 MED ORDER — LEVOTHYROXINE SODIUM 88 MCG PO TABS: 88.0000 ug | ORAL_TABLET | Freq: Every day | ORAL | 0 refills | Status: DC

## 2021-05-06 MED ORDER — PANTOPRAZOLE SODIUM 40 MG PO TBEC: 40.0000 mg | DELAYED_RELEASE_TABLET | Freq: Every day | ORAL | 0 refills | Status: AC

## 2021-05-06 MED ORDER — LEVOTHYROXINE SODIUM 88 MCG PO TABS: 88.0000 ug | ORAL_TABLET | Freq: Every day | ORAL | 0 refills | Status: AC

## 2021-05-06 NOTE — Progress Notes (Signed)
Inpatient Rehabilitation Care Coordinator Discharge Note  The overall goal for the admission was met for:   Discharge location: Yes. D/c to home with his wife who will provide 24/7 care.   Length of Stay: Yes. 6 days.   Discharge activity level: Yes. Supervision.   Home/community participation: Yes. Limited.   Services provided included: MD, RD, PT, OT, RN, CM, TR, Pharmacy, Neuropsych and SW  Financial Services: Medicare and Private Insurance: [place] offered to/list presented to:Yes  Follow-up services arranged: Home Health: [facility] and DME: Pt has RW and 3in1 BSC  Comments (or additional information):  Patient/Family verbalized understanding of follow-up arrangements: Yes  Individual responsible for coordination of the follow-up plan: contact [name] [phone]  Confirmed correct DME delivered: Rana Snare 05/06/2021    Rana Snare

## 2021-05-06 NOTE — Progress Notes (Signed)
Patient discharged off of unit with all belongings. Discharge papers/instructions explained by physician assistant to family. Patient and family have no further questions at time of discharge. No complications noted at this time.  Cesar Ayers L Cesar Ayers  

## 2021-05-06 NOTE — Progress Notes (Signed)
PROGRESS NOTE   Subjective/Complaints:  Pt reports ready for d/c today- no issues. Has appt Th/Fri to remove PEG.   ROS:  Pt denies SOB, abd pain, CP, N/V/C/D, and vision changes    Objective:   No results found. Recent Labs    05/04/21 0704  WBC 174.0*  HGB 10.2*  HCT 33.5*  PLT 216   Recent Labs    05/04/21 0704  NA 137  K 4.3  CL 103  CO2 25  GLUCOSE 156*  BUN 18  CREATININE 1.94*  CALCIUM 8.6*    Intake/Output Summary (Last 24 hours) at 05/06/2021 0943 Last data filed at 05/06/2021 0726 Gross per 24 hour  Intake 720 ml  Output 300 ml  Net 420 ml        Physical Exam: Vital Signs Blood pressure 121/73, pulse 80, temperature 98 F (36.7 C), resp. rate 18, height 5\' 7"  (1.702 m), weight 87.4 kg, SpO2 97 %.   Physical Exam     General: awake, alert, appropriate, laying in bed; NAD HENT: conjugate gaze; oropharynx moist CV: regular rate; no JVD Pulmonary: CTA B/L; no W/R/R- good air movement GI: soft, NT, ND, (+)BS- PEG in place Psychiatric: appropriate Neurological: Ox3  Musculoskeletal:     Cervical back: Normal range of motion and neck supple.     Comments: No edema or tenderness in extremities  Skin:    General: Skin is warm and dry.     Comments: Left forehead with nickel size ulcerated lesion. Large dry scab nasal septum. Both healing  Neurological:     Mental Status: He is alert.     Comments: Alert HOH Motor: 4+/5 throughout      Assessment/Plan: 1. Functional deficits which require 3+ hours per day of interdisciplinary therapy in a comprehensive inpatient rehab setting.  Physiatrist is providing close team supervision and 24 hour management of active medical problems listed below.  Physiatrist and rehab team continue to assess barriers to discharge/monitor patient progress toward functional and medical goals  Care Tool:  Bathing    Body parts bathed by patient:  Right arm,Left arm,Chest,Abdomen,Front perineal area,Buttocks,Right upper leg,Left upper leg,Face,Left lower leg,Right lower leg   Body parts bathed by helper: Right lower leg     Bathing assist Assist Level: Supervision/Verbal cueing     Upper Body Dressing/Undressing Upper body dressing   What is the patient wearing?: Pull over shirt    Upper body assist Assist Level: Independent    Lower Body Dressing/Undressing Lower body dressing      What is the patient wearing?: Underwear/pull up,Pants     Lower body assist Assist for lower body dressing: Supervision/Verbal cueing     Toileting Toileting    Toileting assist Assist for toileting: Independent with assistive device Assistive Device Comment: urinal   Transfers Chair/bed transfer  Transfers assist     Chair/bed transfer assist level: Independent with assistive device Chair/bed transfer assistive device: Programmer, multimedia   Ambulation assist      Assist level: Supervision/Verbal cueing Assistive device: Walker-rolling Max distance: 200'   Walk 10 feet activity   Assist     Assist level: Supervision/Verbal cueing Assistive device:  Walker-rolling   Walk 50 feet activity   Assist    Assist level: Supervision/Verbal cueing Assistive device: Walker-rolling    Walk 150 feet activity   Assist Walk 150 feet activity did not occur: Safety/medical concerns  Assist level: Supervision/Verbal cueing Assistive device: Walker-rolling    Walk 10 feet on uneven surface  activity   Assist Walk 10 feet on uneven surfaces activity did not occur: Safety/medical concerns   Assist level: Supervision/Verbal cueing Assistive device: Aeronautical engineer Will patient use wheelchair at discharge?: No             Wheelchair 50 feet with 2 turns activity    Assist            Wheelchair 150 feet activity     Assist          Blood pressure  121/73, pulse 80, temperature 98 F (36.7 C), resp. rate 18, height 5\' 7"  (1.702 m), weight 87.4 kg, SpO2 97 %.  Medical Problem List and Plan: 1.  Deficits with mobility, endurance secondary to debility             -patient may not shower             -ELOS/Goals: 5-7 days/mod I  -con't PT and OT- per SLP- swallowing issues are esophageal and cannot help  -Continue PT and OT- ELOS 5-7 days- and pt is on track  con't PT and OT- family training today- d/c tomorrow  6/8- d/c today- will get PEG removed this week at [facility]  2.  Impaired mobility -DVT/anticoagulation:  Pharmaceutical: Continue Lovenox             -antiplatelet therapy: N/a 3. Pain Management:             Continue Oxycodone as needed  6/2- pt not taking- suggested taking pain meds to help him participate in therapy.   6/7- pt not taking pain meds- if not taking, doesn't need to go home on them 4. Mood: LCSW to follow for evaluation and support.              -antipsychotic agents: N/a 5. Neuropsych: This patient is capable of making decisions on his own behalf. 6. Skin/Wound Care:              Routine Feeding tube care.              Continue water flushes per tube.  7. Fluids/Electrolytes/Nutrition: Monitor I/O. Recheck lytes Monday 8.  Hyperkalemia: Question due to tube feeds/CKD.              6/2- K+ 5.3- will give a dose of Lokelma and recheck in AM  6/3- K+ still 5.4- actually slightly more - will give 10G of Lokelma and if still up tomorrow, might need renal involved vs scheduled Lokelma.   6/4: K+ reviewed and normalized, repeat Monday  6/6- K+ down to 4.6- con't regimen  6/8- will need labs in the next 1-2 weeks- to see PCP? Vs Oncology, but needs labs.  9.  CLL:             WBCs 187.8 on 5/30             Follow-up as outpatient [facility] attempting to set F/u)  6/3- have ordered for Saturday- if keeps going up, call Heme/Onc to discuss when we should get concerned. WBC 206 yesterday  6/4: WBC reviewed and is trending  downward, repeat Monday  6/6- WBC 174- better- con't to monitor and f/u with Oncology  6/8- will make sure pt sees PCP/Onc in next 2 weeks to get f/u labs             Continue to monitor 10.  Acute blood loss anemia Hgb 10 on 6/4, repeat monday 11. H/o esophageal stricture with PEG:             Tolerating Dysphagia 3, thins with good intake.              Add intermittent supervision at meals  6/2- SLp doesn't feel there's a reason to fallow- since esophageal issues  6/3 will look to remove PEG by IR next week- it's not one I've removed before.   6/6- not able to remove- came from [place] will not remove from another hospital- will let pt know.   6/7- let pt know about PEG_ will get him number to call to schedule 12. T2DM- diet controlled.             HbA1c ordered for tomorrow AM  6/2- BGs are mid 100s; A1c wasn't done- will reorder for AM  6/2- HbA1c 7.2- not as bad as thought it might be- con't regimen             6/5: continue to monitor BS ac/hs  6/6- BGs controlled- con't regimen 12. CKD: Baseline SCr 1.3 per records review             SCr improving from 2.0-->1.83.   6/3- Cr stable at 1.94- push more fluids  6/6- Cr still 1.94- stable- con't regimen  6/8- d/c today- needs to have PCP see if this is new baseline?            59. Hypothyroid: Was on 75 mcg/day PTA.              -- Synthroid increased to 88 mcg on 05/26, continue 14. Dispo  6/8- d/c today- went over that [facility] is removing PEG this week- doesn't need to see me in f/u.     LOS: 7 days A FACE TO FACE EVALUATION WAS PERFORMED  Porsha Skilton 05/06/2021, 9:43 AM

## 2022-01-14 ENCOUNTER — Other Ambulatory Visit: Payer: Self-pay | Admitting: Physical Medicine and Rehabilitation

## 2022-11-24 IMAGING — DX DG CHEST 1V PORT
1 series · 1 of 1 positions shown · non-contrast
Comparison: None.

CLINICAL DATA: Cough.

EXAM:
PORTABLE CHEST 1 VIEW

[chest ap]
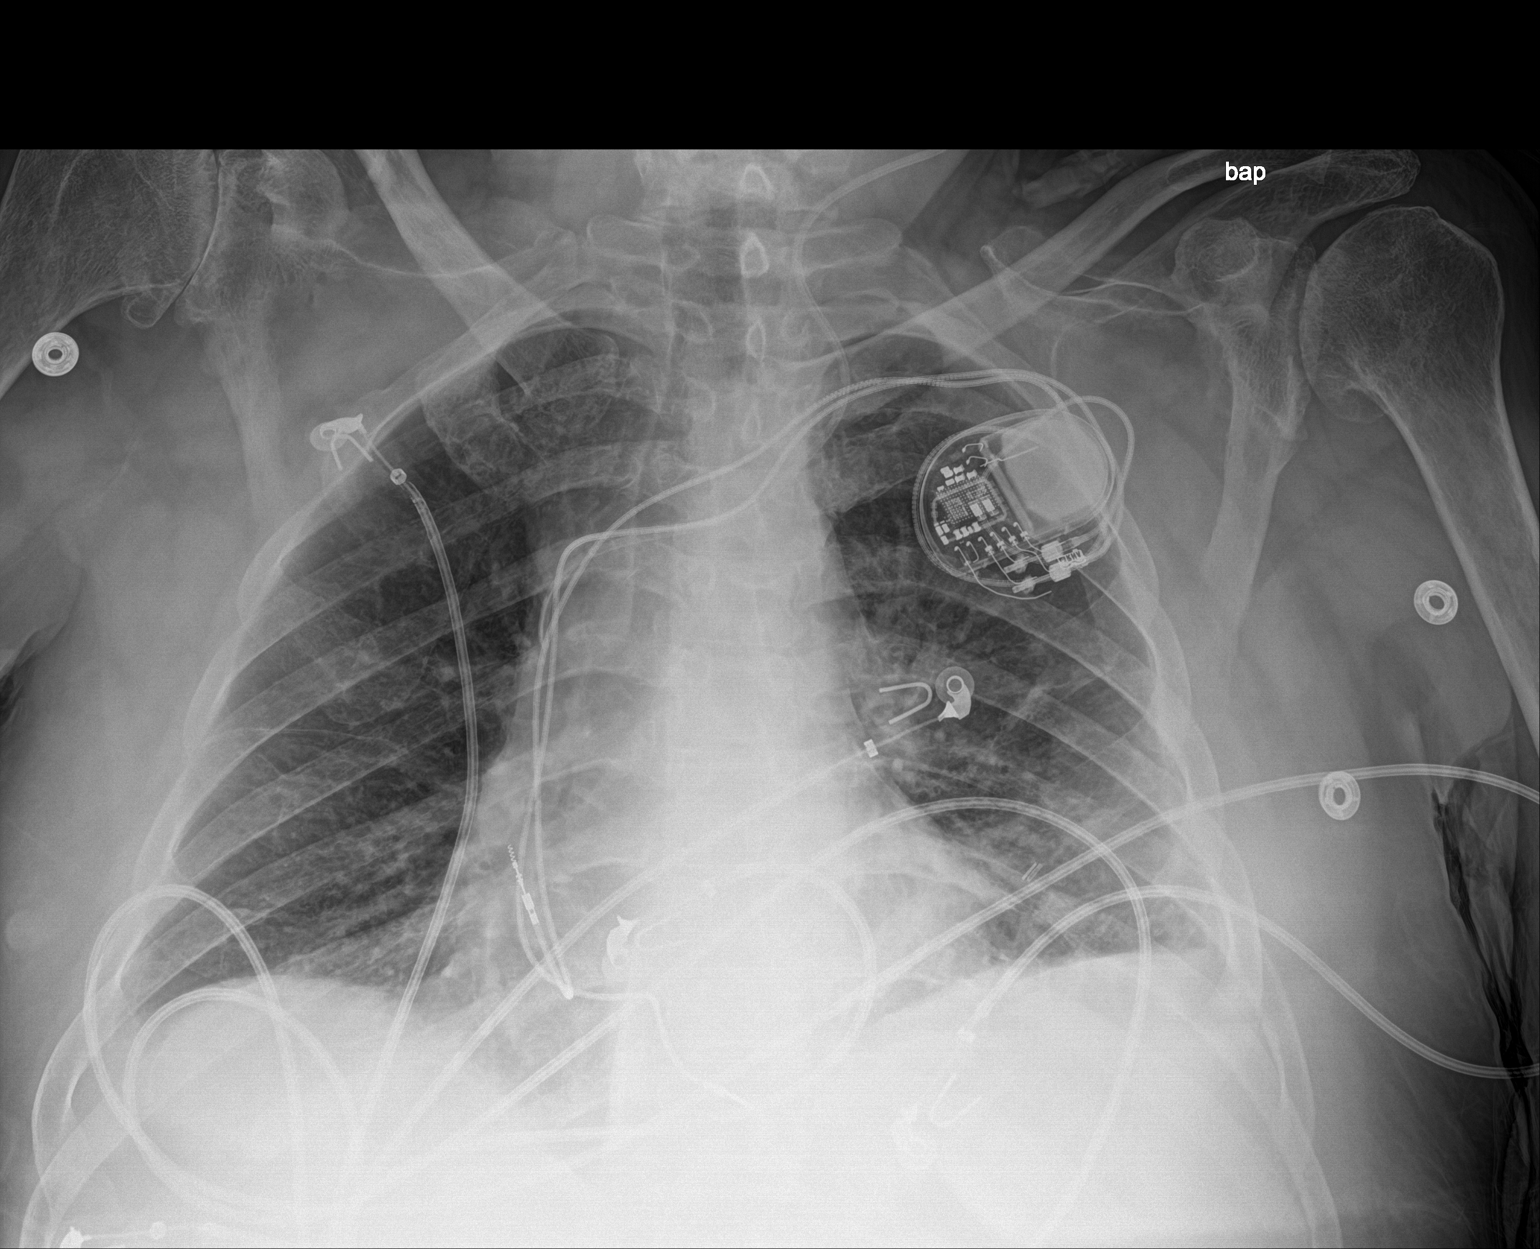

[1 of 1 positions shown; findings below may reference images not displayed]

FINDINGS: The heart size and mediastinal contours are within normal limits.
Left-sided pacemaker is noted with leads in grossly good position.
No pneumothorax or pleural effusion is noted. Right lung is clear.
Mild left basilar atelectasis or infiltrate is noted. The visualized
skeletal structures are unremarkable.
IMPRESSION: Mild left basilar atelectasis or infiltrate.
